# Patient Record
Sex: Male | Born: 1965 | Race: White | Hispanic: No | Marital: Married | Smoking: Never smoker
Health system: Southern US, Community
[De-identification: ages and names within clinical notes are randomized; demographics above are authoritative.]

## PROBLEM LIST (undated history)

## (undated) DIAGNOSIS — IMO0002 Reserved for concepts with insufficient information to code with codable children: Secondary | ICD-10-CM

## (undated) DIAGNOSIS — R9430 Abnormal result of cardiovascular function study, unspecified: Secondary | ICD-10-CM

## (undated) DIAGNOSIS — I4892 Unspecified atrial flutter: Secondary | ICD-10-CM

## (undated) DIAGNOSIS — I4819 Other persistent atrial fibrillation: Secondary | ICD-10-CM

## (undated) DIAGNOSIS — T7840XA Allergy, unspecified, initial encounter: Secondary | ICD-10-CM

## (undated) DIAGNOSIS — Z79899 Other long term (current) drug therapy: Secondary | ICD-10-CM

## (undated) HISTORY — DX: Allergy, unspecified, initial encounter: T78.40XA

## (undated) HISTORY — DX: Other long term (current) drug therapy: Z79.899

## (undated) HISTORY — PX: TONSILLECTOMY AND ADENOIDECTOMY: SUR1326

## (undated) HISTORY — DX: Unspecified atrial flutter: I48.92

## (undated) HISTORY — DX: Other persistent atrial fibrillation: I48.19

## (undated) HISTORY — DX: Reserved for concepts with insufficient information to code with codable children: IMO0002

## (undated) HISTORY — DX: Abnormal result of cardiovascular function study, unspecified: R94.30

---
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
## 1998-03-24 HISTORY — PX: HERNIA REPAIR: SHX51

## 2011-07-22 ENCOUNTER — Encounter: Payer: Self-pay | Admitting: Family Medicine

## 2011-07-22 ENCOUNTER — Ambulatory Visit (INDEPENDENT_AMBULATORY_CARE_PROVIDER_SITE_OTHER): Payer: BC Managed Care – PPO | Admitting: Family Medicine

## 2011-07-22 VITALS — BP 118/72 | HR 84 | Temp 97.8°F | Ht 72.5 in | Wt 226.0 lb

## 2011-07-22 DIAGNOSIS — J309 Allergic rhinitis, unspecified: Secondary | ICD-10-CM

## 2011-07-22 DIAGNOSIS — I499 Cardiac arrhythmia, unspecified: Secondary | ICD-10-CM

## 2011-07-22 DIAGNOSIS — J329 Chronic sinusitis, unspecified: Secondary | ICD-10-CM

## 2011-07-22 DIAGNOSIS — I4892 Unspecified atrial flutter: Secondary | ICD-10-CM

## 2011-07-22 DIAGNOSIS — Z9109 Other allergy status, other than to drugs and biological substances: Secondary | ICD-10-CM

## 2011-07-22 MED ORDER — AZITHROMYCIN 250 MG PO TABS: ORAL_TABLET | ORAL | Status: AC

## 2011-07-22 MED ORDER — METHYLPREDNISOLONE ACETATE 80 MG/ML IJ SUSP
80.0000 mg | Freq: Once | INTRAMUSCULAR | Status: AC
  Administered 2011-07-22: 80 mg via INTRAMUSCULAR

## 2011-07-22 NOTE — Progress Notes (Signed)
Addended by: Gershon Crane A on: 07/22/2011 01:03 PM   Modules accepted: Orders

## 2011-07-22 NOTE — Progress Notes (Signed)
  Subjective:    Patient ID: Shawn Hurley, male    DOB: 04-25-1965, 46 y.o.   MRN: [id number]  HPI 46 yr old male to establish and to discuss allergy issues. He has trouble every spring when the pollen shows up, and he has been struggling the past 3 weeks. He has had a stuffy head, sinus pressure, HA, PND, ST, and a dry cough. No fever. He has been taking Zyrtec every day, which helps some of the symptoms. He has tried Flonase sprays, but these do not help at all.    Review of Systems  Constitutional: Negative.   HENT: Positive for congestion, postnasal drip and sinus pressure.   Eyes: Negative.   Respiratory: Positive for cough and chest tightness. Negative for choking, shortness of breath and wheezing.   Cardiovascular: Negative.        Objective:   Physical Exam  Constitutional: He appears well-developed and well-nourished.  HENT:  Right Ear: External ear normal.  Left Ear: External ear normal.  Nose: Nose normal.  Mouth/Throat: Oropharynx is clear and moist. No oropharyngeal exudate.  Eyes: Conjunctivae are normal. Pupils are equal, round, and reactive to light.  Neck: Neck supple. No thyromegaly present.  Cardiovascular: Normal rate, normal heart sounds and intact distal pulses.  Exam reveals no gallop.   No murmur heard.      Rhythm is quite irregular.  EKG shows atrial flutter/fibrillation  Pulmonary/Chest: Effort normal and breath sounds normal. No respiratory distress. He has no wheezes. He has no rales.  Lymphadenopathy:    He has no cervical adenopathy.          Assessment & Plan:  We will treat the sinusitis with a Zpack. Add Mucinex for congestion. I talked to him about the atrial flutter/fib, and he admits to having some mild fatigue and SOB for several months. No palpitations or chest pains. He says he had a cpx in [place]. [place] about 8 months ago before he moved here, and the doctor told him he had an "irregular rhythm". However he did not discuss this with him, and  did not seem to be concerned about this. We will refer him to Cardiology to evaluate further.

## 2011-07-25 ENCOUNTER — Encounter: Payer: Self-pay | Admitting: Cardiology

## 2011-07-25 ENCOUNTER — Ambulatory Visit (INDEPENDENT_AMBULATORY_CARE_PROVIDER_SITE_OTHER): Payer: BC Managed Care – PPO | Admitting: Cardiology

## 2011-07-25 VITALS — BP 110/80 | HR 83 | Ht 72.05 in | Wt 226.4 lb

## 2011-07-25 DIAGNOSIS — I4891 Unspecified atrial fibrillation: Secondary | ICD-10-CM

## 2011-07-25 LAB — TSH: TSH: 0.987 u[IU]/mL (ref 0.350–4.500)

## 2011-07-25 NOTE — Patient Instructions (Addendum)
Your physician recommends that you schedule a follow-up appointment in: 3-4 weeks.  Your physician has requested that you have an echocardiogram. Echocardiography is a painless test that uses sound waves to create images of your heart. It provides your doctor with information about the size and shape of your heart and how well your heart's chambers and valves are working. This procedure takes approximately one hour. There are no restrictions for this procedure.  Your physician has requested that you have a stress echocardiogram. For further information please visit [email or web address] Please follow instruction sheet as given.  Your physician recommends that you return for lab work in: today (tsh)

## 2011-07-25 NOTE — Progress Notes (Signed)
HPI Patient is seen today as a new patient evaluation for atrial fibrillation. He's healthy gentleman who has no significant complaints. He was being seen by Dr. Clent Ridges. His rhythm was noted to be irregular. EKG revealed the rhythm that is most probably atrial fibrillation. At some point it looks like atrial flutter. He was asymptomatic. He does not have chest pain. He has had some shortness of breath when climbing stairs historically. This has not changed. He's not had syncope or presyncope. There is no history of CHF, hypertension, diabetes, or prior stroke. He is 46 years of age. He is the new chef at the [employer]  No Known Allergies  Current Outpatient Prescriptions  Medication Sig Dispense Refill  . Ascorbic Acid (VITAMIN C) 1000 MG tablet Take 1,000 mg by mouth daily.      Marland Kitchen azithromycin (ZITHROMAX) 250 MG tablet As directed  6 tablet  0  . cetirizine (ZYRTEC) 10 MG tablet Take 10 mg by mouth daily.      . fish oil-omega-3 fatty acids 1000 MG capsule Take 1 g by mouth daily.      Marland Kitchen glucosamine-chondroitin 500-400 MG tablet Take 2 tablets by mouth daily.      Marland Kitchen guaiFENesin (MUCINEX) 600 MG 12 hr tablet Take 600 mg by mouth 2 (two) times daily.      Marland Kitchen ibuprofen (ADVIL,MOTRIN) 200 MG tablet Take 200 mg by mouth as needed.      . Multiple Vitamin (MULTIVITAMIN) capsule Take 1 capsule by mouth daily.      . vitamin E 400 UNIT capsule Take 400 Units by mouth daily.        History   Social History  . Marital Status: Married    Spouse Name: N/A    Number of Children: N/A  . Years of Education: N/A   Occupational History  . Not on file.   Social History Main Topics  . Smoking status: Never Smoker   . Smokeless tobacco: Never Used  . Alcohol Use: 3.5 oz/week    7 drink(s) per week  . Drug Use: No  . Sexually Active: Not on file   Other Topics Concern  . Not on file   Social History Narrative  . No narrative on file    Family History  Problem Relation Age of  Onset  . Stroke Mother     Past Medical History  Diagnosis Date  . Allergy   . Atrial fibrillation     Past Surgical History  Procedure Date  . Hernia repair 2000    umbilical hernia  . Wrist surgery 1979    left wrist, to drain an infection   . Tonsillectomy and adenoidectomy     ROS  Patient denies fever, chills, headache, sweats, rash, change in vision, change in hearing, chest pain, cough, nausea vomiting, urinary symptoms. All other systems are reviewed and are negative.  PHYSICAL EXAM   Patient is oriented to person time and place. Affect is normal. Head is atraumatic. There is no jugular venous distention. No carotid bruits. Lungs are clear. Respiratory effort is nonlabored. Cardiac exam reveals S1 and S2. The rhythm is regular today. There no clicks or significant murmurs. The abdomen is soft. Is no peripheral edema. There no musculoskeletal deformities. There are no skin rashes.  Filed Vitals:   07/25/11 1541  BP: 110/80  Pulse: 83  Height: 6' 0.05" (1.83 m)  Weight: 226 lb 6.4 oz (102.694 kg)   Her EKG is done today and  reviewed by me. QRS is normal. The rhythm is normal sinus with PACs. I have reviewed a prior tracing of July 22, 2011. This rhythm was probably atrial fib. I cannot rule out atrial flutter.  ASSESSMENT & PLAN

## 2011-07-25 NOTE — Assessment & Plan Note (Signed)
The patient has paroxysmal atrial fibrillation. He is asymptomatic. He does have some exertional shortness of breath. His CHADS score is 0. It appears that his rate is controlled when he has the rhythm. He does not need an anticoagulant at this time. The plan will be to obtain a TSH. We will also do a standard echo. He will also have a stress echo. In the meantime he'll go by full activities. I will then see him back for followup. I will consider a monitor at some point to see if we can obtain his atrial fib burden. I've chosen not to do a standard holder as we may just get 2448 hrs. Of sinus rhythm.

## 2011-08-07 ENCOUNTER — Telehealth: Payer: Self-pay

## 2011-08-07 ENCOUNTER — Other Ambulatory Visit: Payer: Self-pay

## 2011-08-07 DIAGNOSIS — I4891 Unspecified atrial fibrillation: Secondary | ICD-10-CM

## 2011-08-07 NOTE — Telephone Encounter (Signed)
Received call from [facility] in scheduling, patient's insurance denied stress echo.She stated insurance recommends treadmill first.Order entered for patient to have treadmill.

## 2011-08-11 ENCOUNTER — Other Ambulatory Visit: Payer: Self-pay

## 2011-08-11 ENCOUNTER — Ambulatory Visit (HOSPITAL_COMMUNITY): Payer: BC Managed Care – PPO | Attending: Internal Medicine

## 2011-08-11 ENCOUNTER — Other Ambulatory Visit (HOSPITAL_COMMUNITY): Payer: BC Managed Care – PPO

## 2011-08-11 DIAGNOSIS — I4891 Unspecified atrial fibrillation: Secondary | ICD-10-CM | POA: Insufficient documentation

## 2011-08-12 NOTE — Telephone Encounter (Signed)
okay

## 2011-08-13 ENCOUNTER — Other Ambulatory Visit (HOSPITAL_COMMUNITY): Payer: BC Managed Care – PPO

## 2011-08-13 NOTE — Telephone Encounter (Signed)
GXT scheduled on 08/22/11 with Ward Givens NP.

## 2011-08-20 NOTE — Progress Notes (Signed)
Pt was notified.  

## 2011-08-21 ENCOUNTER — Ambulatory Visit: Payer: BC Managed Care – PPO | Admitting: Cardiology

## 2011-08-22 ENCOUNTER — Encounter (INDEPENDENT_AMBULATORY_CARE_PROVIDER_SITE_OTHER): Payer: BC Managed Care – PPO

## 2011-08-22 ENCOUNTER — Ambulatory Visit (INDEPENDENT_AMBULATORY_CARE_PROVIDER_SITE_OTHER): Payer: BC Managed Care – PPO | Admitting: Nurse Practitioner

## 2011-08-22 ENCOUNTER — Encounter: Payer: Self-pay | Admitting: Nurse Practitioner

## 2011-08-22 ENCOUNTER — Encounter: Payer: Self-pay | Admitting: Cardiology

## 2011-08-22 VITALS — BP 135/88 | HR 76 | Ht 72.5 in | Wt 226.0 lb

## 2011-08-22 DIAGNOSIS — R0989 Other specified symptoms and signs involving the circulatory and respiratory systems: Secondary | ICD-10-CM

## 2011-08-22 DIAGNOSIS — I4891 Unspecified atrial fibrillation: Secondary | ICD-10-CM

## 2011-08-22 DIAGNOSIS — I480 Paroxysmal atrial fibrillation: Secondary | ICD-10-CM

## 2011-08-22 MED ORDER — ASPIRIN EC 81 MG PO TBEC: 81.0000 mg | DELAYED_RELEASE_TABLET | Freq: Every day | ORAL | Status: DC

## 2011-08-22 MED ORDER — METOPROLOL SUCCINATE ER 25 MG PO TB24: 25.0000 mg | ORAL_TABLET | Freq: Every day | ORAL | Status: DC

## 2011-08-22 NOTE — Patient Instructions (Signed)
Your physician recommends that you schedule a follow-up appointment as scheduled Your physician has recommended that you wear an event monitor. Event monitors are medical devices that record the heart's electrical activity. Doctors most often [place] these monitors to diagnose arrhythmias. Arrhythmias are problems with the speed or rhythm of the heartbeat. The monitor is a small, portable device. You can wear one while you do your normal daily activities. This is usually used to diagnose what is causing palpitations/syncope (passing out).  Your physician has recommended you make the following change in your medication: START Metoprolol Succ 25 mg daily and Aspirin  81 mg daily

## 2011-08-22 NOTE — Progress Notes (Signed)
Patient Name: Shawn Hurley Date of Encounter: 08/22/2011  Primary Care Provider:  Nelwyn Salisbury, MD, MD Primary Cardiologist:  Lovena Neighbours, MD  Patient Profile  47 year old male with recent diagnosis of paroxysmal atrial fibrillation who presents for followup.  Problem List   Past Medical History  Diagnosis Date  . Allergy   . Atrial fibrillation     a. Paroxysmal atrial fibrillation, April, 2013, asymptomatic;  b. 07/2011 Echo: EF 55-60%   Past Surgical History  Procedure Date  . Hernia repair 2000    umbilical hernia  . Wrist surgery 1979    left wrist, to drain an infection   . Tonsillectomy and adenoidectomy     Allergies  No Known Allergies  HPI  46 year old male with the above problems.  He was recently seen by Dr. Myrtis Ser secondary to an incidental finding of irregular heart rhythm and diagnosis of atrial fibrillation.  When he was seen by Dr. Myrtis Ser, he was in sinus rhythm with good rate.  His CHADS2 score is zero.  He was arranged for an exercise treadmill test to take place today however upon arrival, he was noted to be in atrial fibrillation at a rate of 126.  He is completely asymptomatic.  He has no idea how long he may have been in this rhythm or whether or not he goes in and out of it.  Because of the rapid A. Fib, we canceled his treadmill and I have seen him in clinic today.  He denies any chest pain or dyspnea, palpitations, dizziness, or syncope.  Home Medications  Prior to Admission medications   Medication Sig Start Date End Date Taking? Authorizing Provider  Ascorbic Acid (VITAMIN C) 1000 MG tablet Take 1,000 mg by mouth daily.    Historical Provider, MD  aspirin EC 81 MG tablet Take 1 tablet (81 mg total) by mouth daily. 08/22/11 08/21/12  Ok Anis, NP  cetirizine (ZYRTEC) 10 MG tablet Take 10 mg by mouth daily.    Historical Provider, MD  fish oil-omega-3 fatty acids 1000 MG capsule Take 1 g by mouth daily.    Historical Provider, MD    glucosamine-chondroitin 500-400 MG tablet Take 2 tablets by mouth daily.    Historical Provider, MD  guaiFENesin (MUCINEX) 600 MG 12 hr tablet Take 600 mg by mouth 2 (two) times daily.    Historical Provider, MD  ibuprofen (ADVIL,MOTRIN) 200 MG tablet Take 200 mg by mouth as needed.    Historical Provider, MD  metoprolol succinate (TOPROL XL) 25 MG 24 hr tablet Take 1 tablet (25 mg total) by mouth daily. 08/22/11 08/21/12  Ok Anis, NP  Multiple Vitamin (MULTIVITAMIN) capsule Take 1 capsule by mouth daily.    Historical Provider, MD  vitamin E 400 UNIT capsule Take 400 Units by mouth daily.    Historical Provider, MD    Family History  Family History  Problem Relation Age of Onset  . Stroke Mother     Social History  History   Social History  . Marital Status: Married    Spouse Name: N/A    Number of Children: N/A  . Years of Education: N/A   Occupational History  . Not on file.   Social History Main Topics  . Smoking status: Never Smoker   . Smokeless tobacco: Never Used  . Alcohol Use: 3.5 oz/week    7 drink(s) per week  . Drug Use: No  . Sexually Active: Not on file   Other  Topics Concern  . Not on file   Social History Narrative  . No narrative on file     Review of Systems He has been doing well without complaints. He denies pnd, orthopnea, n, v, dizziness, syncope, edema, weight gain, or early satiety. All other systems reviewed and are otherwise negative except as noted above.  Physical Exam  There were no vitals taken for this visit.  General: Pleasant, NAD Psych: Normal affect. Neuro: Alert and oriented X 3. Moves all extremities spontaneously. HEENT: Normal  Neck: Supple without bruits or JVD. Lungs:  Resp regular and unlabored, CTA. Heart: irregularly irregular no s3, s4, or murmurs. Abdomen: Soft, non-tender, non-distended, BS + x 4.  Extremities: No clubbing, cyanosis or edema. DP/PT/Radials 2+ and equal bilaterally.  Accessory  Clinical Findings  ECG - atrial fibrillation, 126, no acute ST or T changes.  Assessment & Plan  1.  Paroxysmal atrial fibrillation: The patient is back in atrial fibrillation but completely asymptomatic.  We canceled his treadmill test and I have provided him a prescription for Toprol-XL 25 mg daily.  I've also recommended low-dose aspirin.  He has already had a normal echocardiogram and normal TSH.  I will arrange for a 21 day event monitor to determine his burden of atrial fibrillation as well as success with rate control on beta blocker therapy.  We will arrange followup with Dr. Myrtis Ser in a few weeks.  Nicolasa Ducking, NP 08/22/2011, 5:39 PM

## 2011-08-22 NOTE — Procedures (Deleted)
Exercise Treadmill Test  Pre-Exercise Testing Evaluation Rhythm: {CHL RHYTHM BASELINE EKG FOR ZOX:09604540}  Rate: {CHL RATE BASELINE EKG FOR ETT:21021048}   PR:  {CHL PR BASELINE EKG FOR ETT:21021049} QRS:  {CHL QRS BASELINE EKG FOR JWJ:19147829}  QT:  {CHL QT BASELINE EKG FOR ETT:21021051} QTc: {CHL QTC BASELINE EKG FOR FAO:13086578}     Test  Exercise Tolerance Test Ordering MD: {CHL LB CARDIOLOGY MD FOR ION:62952841}  Interpreting MD: {CHL LB CARDIOLOGY INTERPRET MD FOR LKG:40102725}  Unique Test No: ***  Treadmill:  {CHL TREADMILL # FOR DGU:44034742}  Indication for ETT: {CHL INDICATION FOR VZD:63875643}  Contraindication to ETT: {CHL CONTRAINDICATION TO PIR:51884166}   Stress Modality: {CHL STRESS MODALITY FOR AYT:01601093}  Cardiac Imaging Performed: {CHL CARDIAC IMAGING PERFORMED FOR ATF:57322025}   Protocol: {CHL PROTOCOL FOR ETT:21021061}  Max BP:  ***/***  Max MPHR (bpm):  *** 85% MPR (bpm):  ***  MPHR obtained (bpm):  *** % MPHR obtained:  ***  Reached 85% MPHR (min:sec):  *** Total Exercise Time (min-sec):  ***  Workload in METS:  *** Borg Scale: ***  Reason ETT Terminated:  {CHL REASON TERMINATED FOR ETT:21021064}    ST Segment Analysis At Rest: {CHL ST SEGMENT AT REST FOR KYH:06237628} With Exercise: {CHL ST SEGMENT WITH EXERCISE FOR BTD:17616073}  Other Information Arrhythmia:  {CHL ARRHYTHMIA FOR XTG:62694854} Angina during ETT:  {CHL ANGINA DURING OEV:03500938} Quality of ETT:  {CHL QUALITY OF HWE:99371696}  ETT Interpretation:  {CHL INTERPRETATION FOR VEL:38101751}  Comments: ***  Recommendations: ***

## 2011-08-31 ENCOUNTER — Encounter: Payer: Self-pay | Admitting: Cardiology

## 2011-08-31 DIAGNOSIS — R9430 Abnormal result of cardiovascular function study, unspecified: Secondary | ICD-10-CM | POA: Insufficient documentation

## 2011-09-02 ENCOUNTER — Ambulatory Visit (INDEPENDENT_AMBULATORY_CARE_PROVIDER_SITE_OTHER): Payer: BC Managed Care – PPO | Admitting: Cardiology

## 2011-09-02 ENCOUNTER — Encounter: Payer: Self-pay | Admitting: Cardiology

## 2011-09-02 VITALS — BP 118/68 | HR 75 | Ht 73.0 in | Wt 231.0 lb

## 2011-09-02 DIAGNOSIS — I480 Paroxysmal atrial fibrillation: Secondary | ICD-10-CM

## 2011-09-02 DIAGNOSIS — I4891 Unspecified atrial fibrillation: Secondary | ICD-10-CM

## 2011-09-02 NOTE — Patient Instructions (Signed)
Your physician recommends that you schedule a follow-up appointment in: 3 months with Dr Chancy Hurter have been referred to Dr Johney Frame for evaluation of your atrial fibrillation  Please continue with the event recorder

## 2011-09-02 NOTE — Assessment & Plan Note (Signed)
I have reviewed the data available an event recorder. I reviewed the echo report showing normal LV function. TSH is normal. The decision at this point he is whether he should receive antiarrhythmic therapy or be seen by electrophysiology for further input. He does not feel his atrial fibrillation. He is young. In his case I wonder if possible ablation will be considered sooner rather than later. I am aware that frequently ablation criteria required the patient has failed 1 drug. In his case and will be difficult to know if he is feeling a drug or not because he does not since his atrial fibrillation. One approach would be to give him a drug and monitor him to see what his atrial fib or numbness. My preference is to get expert opinion at this time from electrophysiology. I explained this to him. We will arrange for consultation. His embolic risk score is very low. He does not need to be anticoagulated.

## 2011-09-02 NOTE — Progress Notes (Signed)
HPI  Patient is seen for followup of atrial fibrillation. I saw him as a new patient on Jul 25, 2011. He had atrial fib. He has no significant embolic risk factors.His EKG when I saw him in the office was sinus rhythm. Decision was made to obtain a TSH that was normal. Decision was also made to do a stress echo. When the patient arrived for his stress echo he was in atrial fibrillation.Therefore the stress was not done but the echo was done. He has normal left ventricular function.Plans there were made for him to wear an event recorder. He has worn it for approximately 2 weeks. He has a significant atrial fibrillation burden. He never feels his atrial fibrillation. He is now here for followup.   No Known Allergies  Current Outpatient Prescriptions  Medication Sig Dispense Refill  . Ascorbic Acid (VITAMIN C) 1000 MG tablet Take 1,000 mg by mouth daily.      Marland Kitchen aspirin EC 81 MG tablet Take 1 tablet (81 mg total) by mouth daily.  90 tablet  3  . cetirizine (ZYRTEC) 10 MG tablet Take 10 mg by mouth daily.      . fish oil-omega-3 fatty acids 1000 MG capsule Take 1 g by mouth daily.      Marland Kitchen glucosamine-chondroitin 500-400 MG tablet Take 2 tablets by mouth daily.      Marland Kitchen guaiFENesin (MUCINEX) 600 MG 12 hr tablet Take 600 mg by mouth as needed.       Marland Kitchen ibuprofen (ADVIL,MOTRIN) 200 MG tablet Take 200 mg by mouth as needed.      . metoprolol succinate (TOPROL XL) 25 MG 24 hr tablet Take 1 tablet (25 mg total) by mouth daily.  30 tablet  11  . Multiple Vitamin (MULTIVITAMIN) capsule Take 1 capsule by mouth daily.      . vitamin E 400 UNIT capsule Take 400 Units by mouth daily.        History   Social History  . Marital Status: Married    Spouse Name: N/A    Number of Children: N/A  . Years of Education: N/A   Occupational History  . Not on file.   Social History Main Topics  . Smoking status: Never Smoker   . Smokeless tobacco: Never Used  . Alcohol Use: 3.5 oz/week    7 drink(s) per week  .  Drug Use: No  . Sexually Active: Not on file   Other Topics Concern  . Not on file   Social History Narrative  . No narrative on file    Family History  Problem Relation Age of Onset  . Stroke Mother     Past Medical History  Diagnosis Date  . Allergy   . Atrial fibrillation     a. Paroxysmal atrial fibrillation, April, 2013, asymptomatic;  b. 07/2011 Echo: EF 55-60%  . Ejection fraction     EF 55-60%, echo, May, 2013    Past Surgical History  Procedure Date  . Hernia repair 2000    umbilical hernia  . Wrist surgery 1979    left wrist, to drain an infection   . Tonsillectomy and adenoidectomy     ROS  Patient denies fever, chills, headache, sweats, rash, change in vision, change in hearing, chest pain, cough, nausea vomiting, urinary symptoms. All other systems are reviewed and are negative.  PHYSICAL EXAM  Patient is oriented to person time and place. Affect is normal. Head is atraumatic. There is no jugulovenous distention. Lungs are  clear. Respiratory effort is nonlabored. Cardiac exam reveals S1 and S2. There no clicks or significant murmurs. The abdomen is soft. There is no peripheral edema. There no musculoskeletal deformities. There are no skin rashes.  Filed Vitals:   09/02/11 0926  BP: 118/68  Pulse: 75  Height: 6\' 1"  (1.854 m)  Weight: 231 lb (104.781 kg)   I have reviewed the strips are available so far on the event recorder. He has a significant amount of atrial fibrillation. He has reasonable rate control on the current beta blocker.  ASSESSMENT & PLAN

## 2011-10-27 ENCOUNTER — Encounter: Payer: Self-pay | Admitting: Internal Medicine

## 2011-10-27 ENCOUNTER — Ambulatory Visit (INDEPENDENT_AMBULATORY_CARE_PROVIDER_SITE_OTHER): Payer: BC Managed Care – PPO | Admitting: Internal Medicine

## 2011-10-27 VITALS — BP 128/76 | HR 134 | Resp 19 | Ht 73.0 in | Wt 233.0 lb

## 2011-10-27 DIAGNOSIS — I4891 Unspecified atrial fibrillation: Secondary | ICD-10-CM

## 2011-10-27 DIAGNOSIS — R0683 Snoring: Secondary | ICD-10-CM

## 2011-10-27 DIAGNOSIS — R0609 Other forms of dyspnea: Secondary | ICD-10-CM

## 2011-10-27 MED ORDER — DILTIAZEM HCL ER COATED BEADS 240 MG PO CP24: 240.0000 mg | ORAL_CAPSULE | Freq: Every day | ORAL | Status: DC

## 2011-10-27 NOTE — Patient Instructions (Addendum)
Your physician recommends that you schedule a follow-up appointment in: 6 weeks Dr Johney Frame  Your physician has recommended you make the following change in your medication:   1) Stop Toprol 2) Start Diltiazem 240mg  daily    Your physician has recommended that you have a sleep study. This test records several body functions during sleep, including: brain activity, eye movement, oxygen and carbon dioxide blood levels, heart rate and rhythm, breathing rate and rhythm, the flow of air through your mouth and nose, snoring, body muscle movements, and chest and belly movement.

## 2011-10-27 NOTE — Progress Notes (Signed)
Primary Care Physician: Nelwyn Salisbury, MD Referring Physician:  Dr Dorian Pod is a 46 y.o. male with a h/o recently diagnosed atrial fibrillation who presents today for EP consultation.  He reports initially being diagnosed with atrial fibrillation several months ago after presenting to Dr Claris Che office 07/22/11 for evaluation of URI symptoms.  He was noted to have an irregular heart beat and was found to have afib by ekg.  He was asymptomatic with his afib.  The duration of his afib is therefore unknown.  He was referred to Dr Myrtis Ser for further management.  He was placed on toprol for rate control and had an event monitor placed.  His monitor revealed significant afib with occasional elevation in V rates.  He did have sinus rhythm also documented.  He is unable to tell when he is in afib.  He reports that his exercise tolerance is reasonably preserved though he is occasionally SOB with heavy activity. Today, he denies symptoms of palpitations, chest pain, orthopnea, PND, lower extremity edema, fatigue, dizziness, presyncope, syncope, or neurologic sequela. The patient is tolerating medications without difficulties and is otherwise without complaint today.   Past Medical History  Diagnosis Date  . Allergy   . Persistent atrial fibrillation     a. Paroxysmal atrial fibrillation, April, 2013, asymptomatic;  b. 07/2011 Echo: EF 55-60%  . Ejection fraction     EF 55-60%, echo, May, 2013   Past Surgical History  Procedure Date  . Hernia repair 2000    umbilical hernia  . Wrist surgery 1979    left wrist, to drain an infection   . Tonsillectomy and adenoidectomy     Current Outpatient Prescriptions  Medication Sig Dispense Refill  . Ascorbic Acid (VITAMIN C) 1000 MG tablet Take 1,000 mg by mouth daily.      Marland Kitchen aspirin EC 81 MG tablet Take 1 tablet (81 mg total) by mouth daily.  90 tablet  3  . fish oil-omega-3 fatty acids 1000 MG capsule Take 1 g by mouth daily.      Marland Kitchen  glucosamine-chondroitin 500-400 MG tablet Take 2 tablets by mouth daily.      Marland Kitchen ibuprofen (ADVIL,MOTRIN) 200 MG tablet Take 200 mg by mouth as needed.      . Multiple Vitamin (MULTIVITAMIN) capsule Take 1 capsule by mouth daily.      . vitamin E 400 UNIT capsule Take 400 Units by mouth daily.      . cetirizine (ZYRTEC) 10 MG tablet Take 10 mg by mouth daily.      Marland Kitchen diltiazem (CARDIZEM CD) 240 MG 24 hr capsule Take 1 capsule (240 mg total) by mouth daily.  90 capsule  3  . guaiFENesin (MUCINEX) 600 MG 12 hr tablet Take 600 mg by mouth as needed.         No Known Allergies  History   Social History  . Marital Status: Married    Spouse Name: N/A    Number of Children: N/A  . Years of Education: N/A   Occupational History  . Not on file.   Social History Main Topics  . Smoking status: Never Smoker   . Smokeless tobacco: Never Used  . Alcohol Use: 3.5 oz/week    7 drink(s) per week     1-2 drinks per day  . Drug Use: No  . Sexually Active: Not on file   Other Topics Concern  . Not on file   Social History Narrative   Lives  in [place]. Works as Water engineer at the [employer]    Family History  Problem Relation Age of Onset  . Stroke Mother     ROS- All systems are reviewed and negative except as per the HPI above  Physical Exam: Filed Vitals:   10/27/11 1146  BP: 128/76  Pulse: 134  Resp: 19  Height: 6\' 1"  (1.854 m)  Weight: 233 lb (105.688 kg)  SpO2: 96%    GEN- The patient is well appearing, alert and oriented x 3 today.   Head- normocephalic, atraumatic Eyes-  Sclera clear, conjunctiva pink Ears- hearing intact Oropharynx- clear Neck- supple, no JVP Lymph- no cervical lymphadenopathy Lungs- Clear to ausculation bilaterally, normal work of breathing Heart- irregular rate and rhythm, no murmurs, rubs or gallops, PMI not laterally displaced GI- soft, NT, ND, + BS Extremities- no clubbing, cyanosis, or edema MS- no significant deformity or  atrophy Skin- no rash or lesion Psych- euthymic mood, full affect Neuro- strength and sensation are intact  EKG today reveals afib V rate 90-110 bpm  Assessment and Plan:

## 2011-10-27 NOTE — Assessment & Plan Note (Signed)
The patient has asymptomatic atrial fibrillation.  Though he had some sinus rhythm on his event monitor, I suspect that he is approaching persistent afib.  Ventricular rates are elevated today. Therapeutic strategies for afib including rate control and rhythm control were discussed in detail with the patient today.  We discussed antiarrhythmic medicine options.  We discussed findings of the AFFIRM trial.   We also discussion ablation, though presently the only indication for catheter ablation is for symptomatic treatment of afib.  As he is presently asymptomatic, I am not certain that he would receive benefit from ablation. At this point, I will stop toprol (which may causes fatigue or ED at higher doses) and start cardizem CD 240mg  daily.  This can be up titrated as needed for rate control.  He will contemplate rhythm control as an option and I will see him again in 6 weeks.  If he decides to proceed with rhythm control, then I would favor flecainide 100mg  BID.  He may require a short treatment with a novel anticoagulant unless he is in sinus rhythm upon return prior to initiation of an AAD.  If he finds that he actually does feel better in sinus rhythm, then ablation may at that time be a reasonable option. Long term, however, he should not require anticoagulation as his CHADSVASC score is 0.

## 2011-11-13 ENCOUNTER — Ambulatory Visit (HOSPITAL_BASED_OUTPATIENT_CLINIC_OR_DEPARTMENT_OTHER): Payer: BC Managed Care – PPO | Attending: Internal Medicine | Admitting: Radiology

## 2011-11-13 VITALS — Ht 73.0 in | Wt 230.0 lb

## 2011-11-13 DIAGNOSIS — R0683 Snoring: Secondary | ICD-10-CM

## 2011-11-13 DIAGNOSIS — I4891 Unspecified atrial fibrillation: Secondary | ICD-10-CM

## 2011-11-13 DIAGNOSIS — G4710 Hypersomnia, unspecified: Secondary | ICD-10-CM | POA: Insufficient documentation

## 2011-11-20 DIAGNOSIS — G4730 Sleep apnea, unspecified: Secondary | ICD-10-CM

## 2011-11-20 DIAGNOSIS — G4710 Hypersomnia, unspecified: Secondary | ICD-10-CM

## 2011-11-21 NOTE — Procedures (Signed)
NAME:  Shawn Hurley, Shawn Hurley                ACCOUNT NO.:  [id number]  MEDICAL RECORD NO.:  [id number]          PATIENT TYPE:  OUT  LOCATION:  SLEEP CENTER                 FACILITY:  [facility]  PHYSICIAN:  Barbaraann Share, MD,FCCPDATE OF BIRTH:  10/04/64  DATE OF STUDY:  11/13/2011                           NOCTURNAL POLYSOMNOGRAM  REFERRING PHYSICIAN:  Hillis Range, MD  INDICATION FOR STUDY:  Hypersomnia with sleep apnea.  EPWORTH SLEEPINESS SCORE:  4.  SLEEP ARCHITECTURE:  The patient had a total sleep time of 357 minutes with adequate slow-wave sleep for age but decreased quantity of REM. Sleep onset latency was normal at 2.5 minutes and REM onset was normal at 101 minutes.  Sleep efficiency was normal at 92%.  RESPIRATORY DATA:  The patient was found to have 2 apneas and 3 obstructive hypopneas, giving him an apnea-hypopnea index of only 0.8 events per hour.  He was noted to have mild snoring, and the events listed above occurred in all body positions.  OXYGEN DATA:  There was O2 desaturation as low as 89% with the patient's obstructive events.  CARDIAC DATA:  Occasional PAC noted, but no clinically significant arrhythmias were seen.  MOVEMENT-PARASOMNIA:  The patient had moderate numbers of limb movements with no significant sleep disruption noted.  IMPRESSIONS-RECOMMENDATIONS: 1. Small numbers of obstructive events which do not meet the AHI     criteria for the obstructive sleep apnea syndrome.  The patient did     not meet split night criteria secondary to his small numbers of     events. 2. Occasional PAC noted, but no clinically significant arrhythmias     were seen.     Barbaraann Share, MD,FCCP Diplomate, American Board of Sleep Medicine    [facility]/MED[facility]  D:  11/20/2011 14:16:21  T:  11/21/2011 04:32:16  Job:  [id number]

## 2011-12-08 ENCOUNTER — Encounter: Payer: Self-pay | Admitting: Internal Medicine

## 2011-12-08 ENCOUNTER — Ambulatory Visit (INDEPENDENT_AMBULATORY_CARE_PROVIDER_SITE_OTHER): Payer: BC Managed Care – PPO | Admitting: Internal Medicine

## 2011-12-08 VITALS — BP 108/64 | HR 76 | Ht 73.0 in | Wt 237.0 lb

## 2011-12-08 DIAGNOSIS — I4891 Unspecified atrial fibrillation: Secondary | ICD-10-CM

## 2011-12-08 MED ORDER — DABIGATRAN ETEXILATE MESYLATE 150 MG PO CAPS: 150.0000 mg | ORAL_CAPSULE | Freq: Two times a day (BID) | ORAL | Status: DC

## 2011-12-08 MED ORDER — FLECAINIDE ACETATE 100 MG PO TABS: 100.0000 mg | ORAL_TABLET | Freq: Two times a day (BID) | ORAL | Status: DC

## 2011-12-08 NOTE — Progress Notes (Signed)
PCP:  Nelwyn Salisbury, MD Primary Cardiologist:  Dr Myrtis Ser  The patient presents today for routine electrophysiology followup.  Since last being seen in our clinic, the patient reports doing very well.  Today, he denies symptoms of palpitations, chest pain, shortness of breath, orthopnea, PND, lower extremity edema, dizziness, presyncope, syncope, or neurologic sequela.  The patient feels that he is tolerating medications without difficulties and is otherwise without complaint today.   Past Medical History  Diagnosis Date  . Allergy   . Persistent atrial fibrillation     a. Paroxysmal atrial fibrillation, April, 2013, asymptomatic;  b. 07/2011 Echo: EF 55-60%  . Ejection fraction     EF 55-60%, echo, May, 2013   Past Surgical History  Procedure Date  . Hernia repair 2000    umbilical hernia  . Wrist surgery 1979    left wrist, to drain an infection   . Tonsillectomy and adenoidectomy     Current Outpatient Prescriptions  Medication Sig Dispense Refill  . Ascorbic Acid (VITAMIN C) 1000 MG tablet Take 1,000 mg by mouth daily.      Marland Kitchen aspirin EC 81 MG tablet Take 1 tablet (81 mg total) by mouth daily.  90 tablet  3  . diltiazem (CARDIZEM CD) 240 MG 24 hr capsule Take 1 capsule (240 mg total) by mouth daily.  90 capsule  3  . fish oil-omega-3 fatty acids 1000 MG capsule Take 1 g by mouth daily.      Marland Kitchen glucosamine-chondroitin 500-400 MG tablet Take 2 tablets by mouth daily.      Marland Kitchen ibuprofen (ADVIL,MOTRIN) 200 MG tablet Take 200 mg by mouth as needed.      . Multiple Vitamin (MULTIVITAMIN) capsule Take 1 capsule by mouth daily.      . vitamin E 400 UNIT capsule Take 400 Units by mouth daily.      . dabigatran (PRADAXA) 150 MG CAPS Take 1 capsule (150 mg total) by mouth every 12 (twelve) hours.  60 capsule  3  . flecainide (TAMBOCOR) 100 MG tablet Take 1 tablet (100 mg total) by mouth 2 (two) times daily.  60 tablet  3    No Known Allergies  History   Social History  . Marital  Status: Married    Spouse Name: N/A    Number of Children: N/A  . Years of Education: N/A   Occupational History  . Not on file.   Social History Main Topics  . Smoking status: Never Smoker   . Smokeless tobacco: Never Used  . Alcohol Use: 3.5 oz/week    7 drink(s) per week     1-2 drinks per day  . Drug Use: No  . Sexually Active: Not on file   Other Topics Concern  . Not on file   Social History Narrative   Lives in [place]. Works as Water engineer at the [employer]    Family History  Problem Relation Age of Onset  . Stroke Mother     ROS-  All systems are reviewed and are negative except as outlined in the HPI above   Physical Exam: Filed Vitals:   12/08/11 1030  BP: 108/64  Pulse: 76  Height: 6\' 1"  (1.854 m)  Weight: 237 lb (107.502 kg)  SpO2: 97%    GEN- The patient is well appearing, alert and oriented x 3 today.   Head- normocephalic, atraumatic Eyes-  Sclera clear, conjunctiva pink Ears- hearing intact Oropharynx- clear Neck- supple, no JVP Lymph- no  cervical lymphadenopathy Lungs- Clear to ausculation bilaterally, normal work of breathing Heart- irregular rate and rhythm, no murmurs, rubs or gallops, PMI not laterally displaced GI- soft, NT, ND, + BS Extremities- no clubbing, cyanosis, or edema MS- no significant deformity or atrophy Skin- no rash or lesion Psych- euthymic mood, full affect Neuro- strength and sensation are intact  ekg today reveals afib, V rate 86 bpm,  Assessment and Plan:

## 2011-12-08 NOTE — Patient Instructions (Addendum)
Your physician recommends that you schedule a follow-up appointment in: 6 weeks with Dr Johney Frame  Your physician has recommended you make the following change in your medication:  1)Start Pradaxa 150mg  twice daily---after taking this for 21days start Flecainide 100mg  twice daily

## 2011-12-08 NOTE — Assessment & Plan Note (Signed)
Well rate controlled We discussed rate control vs rhythm control long term.  At this time, he would like to try to achieve sinus rhythm to see if he feels any symptomatic improvement. I will therefore start pradaxa 150mg  BID today.  We will obtain baseline CBC and BMET He will start flecainide 100mg  BID after he has been on pradaxa for 21 days. He will return to see me in 4-6 weeks.  IF he remains in afib, then he will require cardioversion at that point.

## 2011-12-15 ENCOUNTER — Ambulatory Visit: Payer: BC Managed Care – PPO | Admitting: Cardiology

## 2012-01-19 ENCOUNTER — Encounter: Payer: Self-pay | Admitting: *Deleted

## 2012-01-19 ENCOUNTER — Encounter: Payer: Self-pay | Admitting: Internal Medicine

## 2012-01-19 ENCOUNTER — Ambulatory Visit (INDEPENDENT_AMBULATORY_CARE_PROVIDER_SITE_OTHER): Payer: BC Managed Care – PPO | Admitting: Internal Medicine

## 2012-01-19 VITALS — BP 125/78 | HR 90 | Ht 73.0 in | Wt 243.8 lb

## 2012-01-19 DIAGNOSIS — I4892 Unspecified atrial flutter: Secondary | ICD-10-CM | POA: Insufficient documentation

## 2012-01-19 DIAGNOSIS — I4891 Unspecified atrial fibrillation: Secondary | ICD-10-CM

## 2012-01-19 NOTE — Assessment & Plan Note (Signed)
He has organized from atrial fibrillation into typical appearing atrial flutter.  At this time, we will proceed with cardioversion. Risks, benefits, and alternatives to cardioversion were discussed at length with the patient who wishes to proceed. He is adequately anticoagulation.  He will therefore undergo cardioversion at the next available time.

## 2012-01-19 NOTE — Patient Instructions (Signed)

## 2012-01-19 NOTE — Progress Notes (Signed)
PCP:  Nelwyn Salisbury, MD Primary Cardiologist:  Dr Myrtis Ser  The patient presents today for routine electrophysiology followup.  Since last being seen in our clinic, the patient reports doing very well.  He is tolerating flecainide but is now in atrial flutter. Today, he denies symptoms of palpitations, chest pain, shortness of breath, orthopnea, PND, lower extremity edema, dizziness, presyncope, syncope, or neurologic sequela.  The patient feels that he is tolerating medications without difficulties and is otherwise without complaint today.   Past Medical History  Diagnosis Date  . Allergy   . Persistent atrial fibrillation     a. Paroxysmal atrial fibrillation, April, 2013, asymptomatic;  b. 07/2011 Echo: EF 55-60%  . Ejection fraction     EF 55-60%, echo, May, 2013   Past Surgical History  Procedure Date  . Hernia repair 2000    umbilical hernia  . Wrist surgery 1979    left wrist, to drain an infection   . Tonsillectomy and adenoidectomy     Current Outpatient Prescriptions  Medication Sig Dispense Refill  . Ascorbic Acid (VITAMIN C) 1000 MG tablet Take 1,000 mg by mouth daily.      Marland Kitchen aspirin EC 81 MG tablet Take 1 tablet (81 mg total) by mouth daily.  90 tablet  3  . dabigatran (PRADAXA) 150 MG CAPS Take 1 capsule (150 mg total) by mouth every 12 (twelve) hours.  60 capsule  3  . diltiazem (CARDIZEM CD) 240 MG 24 hr capsule Take 1 capsule (240 mg total) by mouth daily.  90 capsule  3  . fish oil-omega-3 fatty acids 1000 MG capsule Take 1 g by mouth daily.      . flecainide (TAMBOCOR) 100 MG tablet Take 1 tablet (100 mg total) by mouth 2 (two) times daily.  60 tablet  3  . glucosamine-chondroitin 500-400 MG tablet Take 2 tablets by mouth daily.      Marland Kitchen ibuprofen (ADVIL,MOTRIN) 200 MG tablet Take 200 mg by mouth as needed.      . Multiple Vitamin (MULTIVITAMIN) capsule Take 1 capsule by mouth daily.      . vitamin E 400 UNIT capsule Take 400 Units by mouth daily.        No Known  Allergies  History   Social History  . Marital Status: Married    Spouse Name: N/A    Number of Children: N/A  . Years of Education: N/A   Occupational History  . Not on file.   Social History Main Topics  . Smoking status: Never Smoker   . Smokeless tobacco: Never Used  . Alcohol Use: 3.5 oz/week    7 drink(s) per week     1-2 drinks per day  . Drug Use: No  . Sexually Active: Not on file   Other Topics Concern  . 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 Stroke Mother     ROS-  All systems are reviewed and are negative except as outlined in the HPI above   Physical Exam: Filed Vitals:   01/19/12 1517  BP: 125/78  Pulse: 90  Height: 6\' 1"  (1.854 m)  Weight: 243 lb 12.8 oz (110.587 kg)    GEN- The patient is well appearing, alert and oriented x 3 today.   Head- normocephalic, atraumatic Eyes-  Sclera clear, conjunctiva pink Ears- hearing intact Oropharynx-  clear Neck- supple, no JVP Lymph- no cervical lymphadenopathy Lungs- Clear to ausculation bilaterally, normal work of breathing Heart- irregular rate and rhythm, no murmurs, rubs or gallops, PMI not laterally displaced GI- soft, NT, ND, + BS Extremities- no clubbing, cyanosis, or edema MS- no significant deformity or atrophy Skin- no rash or lesion Psych- euthymic mood, full affect Neuro- strength and sensation are intact  ekg today reveals atrial flutter, V rate 92 bpm  Assessment and Plan:

## 2012-01-19 NOTE — Assessment & Plan Note (Signed)
As above.

## 2012-01-20 LAB — CBC WITH DIFFERENTIAL/PLATELET
Basophils Absolute: 0 10*3/uL (ref 0.0–0.1)
Eosinophils Relative: 3 % (ref 0.0–5.0)
HCT: 43.5 % (ref 39.0–52.0)
Lymphs Abs: 1.4 10*3/uL (ref 0.7–4.0)
Monocytes Absolute: 0.4 10*3/uL (ref 0.1–1.0)
Monocytes Relative: 6.1 % (ref 3.0–12.0)
Neutrophils Relative %: 65.5 % (ref 43.0–77.0)
Platelets: 196 10*3/uL (ref 150.0–400.0)
RDW: 12.9 % (ref 11.5–14.6)
WBC: 5.8 10*3/uL (ref 4.5–10.5)

## 2012-01-20 LAB — BASIC METABOLIC PANEL
BUN: 16 mg/dL (ref 6–23)
Creatinine, Ser: 1 mg/dL (ref 0.4–1.5)
GFR: 87.05 mL/min (ref >60.00)
Glucose, Bld: 89 mg/dL (ref 70–99)

## 2012-01-20 LAB — MAGNESIUM: Magnesium: 2.1 mg/dL (ref 1.5–2.5)

## 2012-01-26 ENCOUNTER — Ambulatory Visit (HOSPITAL_COMMUNITY)
Admission: RE | Admit: 2012-01-26 | Discharge: 2012-01-26 | Disposition: A | Discharge status: Home / Self Care | Payer: BC Managed Care – PPO | Source: Ambulatory Visit | Attending: Cardiology | Admitting: Cardiology

## 2012-01-26 ENCOUNTER — Encounter (HOSPITAL_COMMUNITY)
Admission: RE | Disposition: A | Discharge status: Home / Self Care | Payer: Self-pay | Source: Ambulatory Visit | Attending: Cardiology

## 2012-01-26 SURGERY — CANCELLED PROCEDURE

## 2012-03-08 ENCOUNTER — Encounter: Payer: Self-pay | Admitting: Internal Medicine

## 2012-03-08 ENCOUNTER — Ambulatory Visit (INDEPENDENT_AMBULATORY_CARE_PROVIDER_SITE_OTHER): Payer: BC Managed Care – PPO | Admitting: Internal Medicine

## 2012-03-08 VITALS — BP 120/62 | HR 79 | Ht 73.0 in | Wt 240.0 lb

## 2012-03-08 DIAGNOSIS — I4891 Unspecified atrial fibrillation: Secondary | ICD-10-CM

## 2012-03-08 NOTE — Progress Notes (Signed)
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  Since last being seen in our clinic, the patient reports doing very well. He spontaneously converted to sinus rhythm. He thinks that his energy may be slightly improved with sinus.  Today, he denies symptoms of palpitations, chest pain, shortness of breath,  lower extremity edema, dizziness, presyncope, or syncope.  The patient is otherwise without complaint today.   Past Medical History  Diagnosis Date  . Allergy   . Persistent atrial fibrillation     a. Paroxysmal atrial fibrillation, April, 2013, asymptomatic;  b. 07/2011 Echo: EF 55-60%  . Ejection fraction     EF 55-60%, echo, May, 2013   Past Surgical History  Procedure Date  . Hernia repair 2000    umbilical hernia  . Wrist surgery 1979    left wrist, to drain an infection   . Tonsillectomy and adenoidectomy     Current Outpatient Prescriptions  Medication Sig Dispense Refill  . Ascorbic Acid (VITAMIN C) 1000 MG tablet Take 1,000 mg by mouth daily.      . dabigatran (PRADAXA) 150 MG CAPS Take 1 capsule (150 mg total) by mouth every 12 (twelve) hours.  60 capsule  3  . diltiazem (CARDIZEM CD) 240 MG 24 hr capsule Take 1 capsule (240 mg total) by mouth daily.  90 capsule  3  . fish oil-omega-3 fatty acids 1000 MG capsule Take 1 g by mouth daily.      . flecainide (TAMBOCOR) 100 MG tablet Take 1 tablet (100 mg total) by mouth 2 (two) times daily.  60 tablet  3  . glucosamine-chondroitin 500-400 MG tablet Take 2 tablets by mouth daily.      Marland Kitchen ibuprofen (ADVIL,MOTRIN) 200 MG tablet Take 200 mg by mouth as needed.      . Multiple Vitamin (MULTIVITAMIN) capsule Take 1 capsule by mouth daily.      . vitamin E 400 UNIT capsule Take 400 Units by mouth daily.        Physical Exam: Filed Vitals:   03/08/12 1004  BP: 120/62  Pulse: 79  Height: 6\' 1"  (1.854 m)  Weight: 240 lb (108.863 kg)     GEN- The patient is well appearing, alert and oriented x 3 today.   Head- normocephalic, atraumatic Eyes-  Sclera clear, conjunctiva pink Ears- hearing intact Oropharynx- clear Lungs- Clear to ausculation bilaterally, normal work of breathing Heart- Regular rate and rhythm with frequent ectopy, no murmurs, rubs or gallops, PMI not laterally displaced GI- soft, NT, ND, + BS Extremities- no clubbing, cyanosis, or edema  ekg today reveals sinus rhythm with frequent PACs and a single PVC, PR 170, QRS 98, Qtc 449, otherwise normal ekg  Assessment and Plan:

## 2012-03-08 NOTE — Assessment & Plan Note (Signed)
Now in sinus rhythm with flecainide CHADSVASC score is 0.  We will stop pradaxa today.  He will need a GXT on flecainide to evaluate for exercise induced arrhythmias.  We will order this for early January per his requests.

## 2012-03-08 NOTE — Patient Instructions (Signed)
Your physician wants you to follow-up in: 6 months with Dr Jacquiline Doe will receive a reminder letter in the mail two months in advance. If you don't receive a letter, please call our office to schedule the follow-up appointment.  Your physician has requested that you have an exercise tolerance test. For further information please visit [email or web address] Please also follow instruction sheet, as given.---after the first of the year

## 2012-03-22 ENCOUNTER — Ambulatory Visit (INDEPENDENT_AMBULATORY_CARE_PROVIDER_SITE_OTHER): Payer: BC Managed Care – PPO | Admitting: Nurse Practitioner

## 2012-03-22 ENCOUNTER — Encounter: Payer: Self-pay | Admitting: Nurse Practitioner

## 2012-03-22 VITALS — BP 135/80 | HR 75

## 2012-03-22 DIAGNOSIS — I4891 Unspecified atrial fibrillation: Secondary | ICD-10-CM

## 2012-03-22 NOTE — Progress Notes (Signed)
Exercise Treadmill Test  Pre-Exercise Testing Evaluation Rhythm: Sinus with PAC's Rate: 75                 Test  Exercise Tolerance Test Ordering MD: Hillis Range, MD  Interpreting MD: Norma Fredrickson, NP  Unique Test No: 1   Treadmill:  1  Indication for ETT: F/U flecainide  Contraindication to ETT: No   Stress Modality: exercise - treadmill  Cardiac Imaging Performed: non   Protocol: standard Bruce - maximal  Max BP:  189/71  Max MPHR (bpm):  173 85% MPR (bpm):  147  MPHR obtained (bpm):  150 % MPHR obtained:  86%  Reached 85% MPHR (min:sec):  10:292 Total Exercise Time (min-sec):  11 minutes  Workload in METS:  13.2 Borg Scale: 15  Reason ETT Terminated:  patient's desire to stop    ST Segment Analysis At Rest: normal ST segments - no evidence of significant ST depression With Exercise: no evidence of significant ST depression  Other Information Arrhythmia:  No Angina during ETT:  absent (0) Quality of ETT:  diagnostic  ETT Interpretation:  normal - no evidence of ischemia by ST analysis  Comments: Patient presents today for routine GXT. Has been started on Flecainide for PAF. He is currently doing well. Has no complaints. Rhythm has been good. Tolerating his medicines.  Today, he exercised on the standard Bruce protocol for 11 minutes. Excellent exercise tolerance. Adequate blood pressure response. Clinically negative. EKG negative for ischemia. PAC's noted prior to stress testing and in recovery. No ventricular arrhythmia. No atrial fib noted.   Recommendations: Continue current medicines Ok to exercise Has follow up with Dr. Johney Frame in 6 months.   Patient is agreeable to this plan and will call if any problems develop in the interim.

## 2012-04-23 ENCOUNTER — Other Ambulatory Visit: Payer: Self-pay | Admitting: Emergency Medicine

## 2012-04-23 MED ORDER — FLECAINIDE ACETATE 100 MG PO TABS: 100.0000 mg | ORAL_TABLET | Freq: Two times a day (BID) | ORAL | Status: DC

## 2012-07-12 ENCOUNTER — Ambulatory Visit (INDEPENDENT_AMBULATORY_CARE_PROVIDER_SITE_OTHER): Payer: BC Managed Care – PPO | Admitting: Family Medicine

## 2012-07-12 ENCOUNTER — Encounter: Payer: Self-pay | Admitting: Family Medicine

## 2012-07-12 VITALS — BP 130/80 | HR 104 | Temp 97.9°F | Wt 236.0 lb

## 2012-07-12 DIAGNOSIS — I4891 Unspecified atrial fibrillation: Secondary | ICD-10-CM

## 2012-07-12 DIAGNOSIS — J019 Acute sinusitis, unspecified: Secondary | ICD-10-CM

## 2012-07-12 MED ORDER — METHYLPREDNISOLONE ACETATE 80 MG/ML IJ SUSP
120.0000 mg | Freq: Once | INTRAMUSCULAR | Status: AC
  Administered 2012-07-12: 120 mg via INTRAMUSCULAR

## 2012-07-12 MED ORDER — AMOXICILLIN-POT CLAVULANATE 875-125 MG PO TABS: 1.0000 | ORAL_TABLET | Freq: Two times a day (BID) | ORAL | Status: DC

## 2012-07-12 NOTE — Progress Notes (Signed)
  Subjective:    Patient ID: Shawn Hurley, male    DOB: 02-11-1966, 47 y.o.   MRN: [id number]  HPI Here for 5 days of sinus pressure, PND, ST, and a dry cough. No fever.    Review of Systems  Constitutional: Negative.   HENT: Positive for congestion, postnasal drip and sinus pressure.   Eyes: Negative.   Respiratory: Positive for cough. Negative for shortness of breath and wheezing.   Cardiovascular: Negative.        Objective:   Physical Exam  Constitutional: He appears well-developed and well-nourished. No distress.  HENT:  Right Ear: External ear normal.  Left Ear: External ear normal.  Nose: Nose normal.  Mouth/Throat: Oropharynx is clear and moist.  Eyes: Conjunctivae are normal.  Cardiovascular: Normal rate, normal heart sounds and intact distal pulses.   Occasional ectopy   Pulmonary/Chest: Effort normal and breath sounds normal.  Lymphadenopathy:    He has no cervical adenopathy.          Assessment & Plan:  Recheck prn. He is due to follow up with Dr. Johney Frame in 3 weeks

## 2012-07-12 NOTE — Addendum Note (Signed)
Addended by: Azucena Freed on: 07/12/2012 01:39 PM   Modules accepted: Orders

## 2012-08-18 ENCOUNTER — Encounter: Payer: Self-pay | Admitting: Internal Medicine

## 2012-08-18 ENCOUNTER — Ambulatory Visit (INDEPENDENT_AMBULATORY_CARE_PROVIDER_SITE_OTHER): Payer: BC Managed Care – PPO | Admitting: Internal Medicine

## 2012-08-18 VITALS — BP 120/72 | HR 70 | Ht 73.0 in | Wt 234.4 lb

## 2012-08-18 DIAGNOSIS — I4891 Unspecified atrial fibrillation: Secondary | ICD-10-CM

## 2012-08-18 NOTE — Progress Notes (Signed)
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  Since last being seen in our clinic, the patient reports doing very well.  His exercise tolerance remains improved with sinus rhythm.  He is unaware of any afib.  Today, he denies symptoms of palpitations, chest pain, shortness of breath,  lower extremity edema, dizziness, presyncope, or syncope.  The patient is otherwise without complaint today.   Past Medical History  Diagnosis Date  . Allergy   . Persistent atrial fibrillation     a. Paroxysmal atrial fibrillation, April, 2013, asymptomatic;  b. 07/2011 Echo: EF 55-60%  . Ejection fraction     EF 55-60%, echo, May, 2013  . High risk medication use     Flecainide   Past Surgical History  Procedure Laterality Date  . Hernia repair  2000    umbilical hernia  . Wrist surgery  1979    left wrist, to drain an infection   . Tonsillectomy and adenoidectomy      Current Outpatient Prescriptions  Medication Sig Dispense Refill  . Ascorbic Acid (VITAMIN C) 1000 MG tablet Take 1,000 mg by mouth daily.      Marland Kitchen aspirin 81 MG tablet Take 81 mg by mouth daily.      Marland Kitchen diltiazem (CARDIZEM CD) 240 MG 24 hr capsule Take 1 capsule (240 mg total) by mouth daily.  90 capsule  3  . fish oil-omega-3 fatty acids 1000 MG capsule Take 1 g by mouth daily.      . flecainide (TAMBOCOR) 100 MG tablet Take 1 tablet (100 mg total) by mouth 2 (two) times daily.  60 tablet  5  . glucosamine-chondroitin 500-400 MG tablet Take 2 tablets by mouth daily.      Marland Kitchen ibuprofen (ADVIL,MOTRIN) 200 MG tablet Take 200 mg by mouth as needed.      . Multiple Vitamin (MULTIVITAMIN) capsule Take 1 capsule by mouth daily.      . vitamin E 400 UNIT capsule Take 400 Units by mouth daily.       No current facility-administered medications for this visit.    Physical Exam: Filed Vitals:   08/18/12 1004  BP: 120/72  Pulse: 70  Height: 6'  1" (1.854 m)  Weight: 234 lb 6.4 oz (106.323 kg)    GEN- The patient is well appearing, alert and oriented x 3 today.   Head- normocephalic, atraumatic Eyes-  Sclera clear, conjunctiva pink Ears- hearing intact Oropharynx- clear Lungs- Clear to ausculation bilaterally, normal work of breathing Heart- Regular rate and rhythm with frequent ectopy, no murmurs, rubs or gallops, PMI not laterally displaced GI- soft, NT, ND, + BS Extremities- no clubbing, cyanosis, or edema  ekg today reveals sinus rhythm with PACs 70 bpm, PR 184, QRS 96, Qtc 438, otherwise normal ekg  Assessment and Plan:  1. afib Stable No change required today  Return in 9 months

## 2012-08-18 NOTE — Patient Instructions (Signed)
Your physician wants you to follow-up in: 9 months with Dr Allred You will receive a reminder letter in the mail two months in advance. If you don't receive a letter, please call our office to schedule the follow-up appointment.  

## 2012-09-26 ENCOUNTER — Other Ambulatory Visit: Payer: Self-pay | Admitting: Internal Medicine

## 2012-09-27 ENCOUNTER — Other Ambulatory Visit: Payer: Self-pay

## 2012-09-28 ENCOUNTER — Other Ambulatory Visit: Payer: Self-pay

## 2012-09-28 MED ORDER — DILTIAZEM HCL ER COATED BEADS 240 MG PO CP24: 240.0000 mg | ORAL_CAPSULE | Freq: Every day | ORAL | Status: DC

## 2012-10-25 ENCOUNTER — Other Ambulatory Visit: Payer: Self-pay | Admitting: Internal Medicine

## 2013-02-03 ENCOUNTER — Ambulatory Visit (INDEPENDENT_AMBULATORY_CARE_PROVIDER_SITE_OTHER): Payer: BC Managed Care – PPO | Admitting: Family Medicine

## 2013-02-03 ENCOUNTER — Encounter: Payer: Self-pay | Admitting: Family Medicine

## 2013-02-03 VITALS — BP 126/80 | HR 86 | Temp 99.0°F | Wt 246.0 lb

## 2013-02-03 DIAGNOSIS — J019 Acute sinusitis, unspecified: Secondary | ICD-10-CM

## 2013-02-03 DIAGNOSIS — J020 Streptococcal pharyngitis: Secondary | ICD-10-CM

## 2013-02-03 MED ORDER — CEFUROXIME AXETIL 500 MG PO TABS: 500.0000 mg | ORAL_TABLET | Freq: Two times a day (BID) | ORAL | Status: DC

## 2013-02-03 NOTE — Progress Notes (Signed)
  Subjective:    Patient ID: Shawn Hurley, male    DOB: 11/15/1965, 47 y.o.   MRN: [id number]  HPI Here for 5 days of facial pressure and sinus pain, PND, ST,and a dry cough. No fever.    Review of Systems  Constitutional: Negative.   HENT: Positive for congestion, postnasal drip and sinus pressure.   Eyes: Negative.   Respiratory: Positive for cough.        Objective:   Physical Exam  Constitutional: He appears well-developed and well-nourished.  hoarse  HENT:  Right Ear: External ear normal.  Left Ear: External ear normal.  Nose: Nose normal.  Mouth/Throat: Oropharynx is clear and moist. No oropharyngeal exudate.  Eyes: Conjunctivae are normal.  Pulmonary/Chest: Effort normal and breath sounds normal.  Lymphadenopathy:    He has no cervical adenopathy.          Assessment & Plan:  Given Ceftin and add Mucinex.

## 2013-02-03 NOTE — Progress Notes (Signed)
Pre visit review using our clinic review tool, if applicable. No additional management support is needed unless otherwise documented below in the visit note. 

## 2013-03-21 ENCOUNTER — Encounter: Payer: Self-pay | Admitting: Internal Medicine

## 2013-03-21 ENCOUNTER — Ambulatory Visit (INDEPENDENT_AMBULATORY_CARE_PROVIDER_SITE_OTHER): Payer: BC Managed Care – PPO | Admitting: Internal Medicine

## 2013-03-21 ENCOUNTER — Ambulatory Visit (INDEPENDENT_AMBULATORY_CARE_PROVIDER_SITE_OTHER)
Admission: RE | Admit: 2013-03-21 | Discharge: 2013-03-21 | Disposition: A | Discharge status: Home / Self Care | Payer: BC Managed Care – PPO | Source: Ambulatory Visit | Attending: Internal Medicine | Admitting: Internal Medicine

## 2013-03-21 VITALS — BP 114/60 | HR 87 | Temp 98.0°F | Wt 243.0 lb

## 2013-03-21 DIAGNOSIS — J22 Unspecified acute lower respiratory infection: Secondary | ICD-10-CM

## 2013-03-21 DIAGNOSIS — J988 Other specified respiratory disorders: Secondary | ICD-10-CM

## 2013-03-21 DIAGNOSIS — J189 Pneumonia, unspecified organism: Secondary | ICD-10-CM

## 2013-03-21 MED ORDER — AZITHROMYCIN 250 MG PO TABS: 250.0000 mg | ORAL_TABLET | ORAL | Status: DC

## 2013-03-21 MED ORDER — HYDROCODONE-HOMATROPINE 5-1.5 MG/5ML PO SYRP: 5.0000 mL | ORAL_SOLUTION | ORAL | Status: DC | PRN

## 2013-03-21 MED ORDER — AMOXICILLIN 500 MG PO TABS: 1000.0000 mg | ORAL_TABLET | Freq: Three times a day (TID) | ORAL | Status: DC

## 2013-03-21 MED ORDER — CEFTRIAXONE SODIUM 1 G IJ SOLR
1.0000 g | Freq: Once | INTRAMUSCULAR | Status: AC
  Administered 2013-03-21: 1 g via INTRAMUSCULAR

## 2013-03-21 NOTE — Progress Notes (Signed)
   Subjective:    Patient ID: Shawn Hurley, male    DOB: [birth year], 47 y.o.   MRN: [id number]  HPI    Review of Systems     Objective:   Physical Exam        Assessment & Plan:

## 2013-03-21 NOTE — Progress Notes (Signed)
Chief Complaint  Patient presents with  . Fever    Started Christmas Eve.  Cough is productive of a light green sputum.  Rotating Tylenol and Advil.  Also taking Mucinex.  Has taken Tylenol two hours ago.  . Generalized Body Aches  . Cough  . Dizziness    HPI: Patient comes in today for SDA for  new problem evaluation. PCP NASee above  Onset with high fever 103  body aches and cough 12 24 t Family has been sick and daughter had PNA sx.   Thought  achiness and cough .  was getting better   Then fever and cough increasing  Again  Still with fever 101 range.  Taking tylenol  Advil.   . Not a lot of ur congestion    Was on antibiotic recently for ? Ur   Off at least a month.  Has shortness of breath and doe  Walking dog  New . No cp coughing speels  No hemoptysis   ROS: See pertinent positives and negatives per HPI. Has  Af and arrythmia  Under caer on meds  No racing heart noted at this time  No hx of under lying lung disease   Past Medical History  Diagnosis Date  . Allergy   . Persistent atrial fibrillation     a. Paroxysmal atrial fibrillation, April, 2013, asymptomatic;  b. 07/2011 Echo: EF 55-60%  . Ejection fraction     EF 55-60%, echo, May, 2013  . High risk medication use     Flecainide    Family History  Problem Relation Age of Onset  . Stroke Mother     History   Social History  . Marital Status: Married    Spouse Name: N/A    Number of Children: N/A  . Years of Education: N/A   Social History Main Topics  . Smoking status: Never Smoker   . Smokeless tobacco: Never Used  . Alcohol Use: 3.5 oz/week    7 drink(s) per week     Comment: 1-2 drinks per day  . Drug Use: No  . Sexual Activity: Yes   Other Topics Concern  . None   Social History Narrative   Lives in [place]. Works as Water engineer at the [employer] Prescriptions as of 03/21/2013  Medication Sig  . Ascorbic Acid (VITAMIN C) 1000 MG tablet Take 1,000 mg by  mouth daily.  Marland Kitchen aspirin 81 MG tablet Take 81 mg by mouth daily.  Marland Kitchen diltiazem (CARDIZEM CD) 240 MG 24 hr capsule Take 1 capsule (240 mg total) by mouth daily.  . fish oil-omega-3 fatty acids 1000 MG capsule Take 1 g by mouth daily.  . flecainide (TAMBOCOR) 100 MG tablet TAKE 1 TABLET BY MOUTH TWICE DAILY  . glucosamine-chondroitin 500-400 MG tablet Take 2 tablets by mouth daily.  Marland Kitchen ibuprofen (ADVIL,MOTRIN) 200 MG tablet Take 200 mg by mouth as needed.  . Multiple Vitamin (MULTIVITAMIN) capsule Take 1 capsule by mouth daily.  . vitamin E 400 UNIT capsule Take 400 Units by mouth daily.  Marland Kitchen amoxicillin (AMOXIL) 500 MG tablet Take 2 tablets (1,000 mg total) by mouth 3 (three) times daily. For pneumonia  . azithromycin (ZITHROMAX Z-PAK) 250 MG tablet Take 1 tablet (250 mg total) by mouth as directed. Take 2 po first day, then 1 po qd  . HYDROcodone-homatropine (HYCODAN) 5-1.5 MG/5ML syrup Take 5 mLs by mouth every 4 (four) hours as needed for cough.  . [DISCONTINUED]  cefUROXime (CEFTIN) 500 MG tablet Take 1 tablet (500 mg total) by mouth 2 (two) times daily with a meal.  . [EXPIRED] cefTRIAXone (ROCEPHIN) injection 1 g     EXAM:  BP 114/60  Pulse 87  Temp(Src) 98 F (36.7 C) (Oral)  Wt 243 lb (110.224 kg)  SpO2 97%  Body mass index is 32.07 kg/(m^2). WDWN in NAD  quiet respirations; mildly congested  . Non toxic . HEENT: Normocephalic ;atraumatic , Eyes;  PERRL, EOMs  Full, lids and conjunctiva clear,,Ears: no deformities, canals nl, TM landmarks normal, Nose: no deformity or discharge but congested;face non  tender Mouth : OP clear without lesion or edema . Neck: Supple without adenopathy or masses or bruits Chest:   Bilateral crackles rales at bases   No rubs no retractions  CV:  S1-S2 no gallops or murmurs peripheral perfusion is normal Skin :nl perfusion and no acute rashes  MS: moves all extremities without noticeable focal  abnormality PSYCH: pleasant and cooperative, no obvious  depression or anxiety  ASSESSMENT AND PLAN:  Discussed the following assessment and plan:  CAP (community acquired pneumonia) - initially flu like illness at onset - Plan: DG Chest 2 View, cefTRIAXone (ROCEPHIN) injection 1 g  Acute respiratory infection - Plan: DG Chest 2 View, cefTRIAXone (ROCEPHIN) injection 1 g Suspect pneumonia whether secondary or primary 6 days of fever relapse after some improvement. Rocephin given in office sent for chest x-ray noted to be consistent with exam. Given high-dose amoxicillin and azithromycin. Consider Levaquin but avoiding quinolones of possible because of his antiarrhythmic medication and risk of QT interval may be more pronounced with quinolones. -Patient advised to return or notify health care team  if symptoms worsen or persist or new concerns arise.  Patient Instructions  You lung exam is consistent with  Possible pneumonia with tthe prolonged fever . Injection of antibiotic and get an x ray asap . We will contact you about results and further antibiotic rx and follow up. NO work until fever is gone and you are feeling better .    Pneumonia, Adult Pneumonia is an infection of the lungs.  CAUSES Pneumonia may be caused by bacteria or a virus. Usually, these infections are caused by breathing infectious particles into the lungs (respiratory tract). SYMPTOMS   Cough.  Fever.  Chest pain.  Increased rate of breathing.  Wheezing.  Mucus production. DIAGNOSIS  If you have the common symptoms of pneumonia, your caregiver will typically confirm the diagnosis with a chest X-ray. The X-ray will show an abnormality in the lung (pulmonary infiltrate) if you have pneumonia. Other tests of your blood, urine, or sputum may be done to find the specific cause of your pneumonia. Your caregiver may also do tests (blood gases or pulse oximetry) to see how well your lungs are working. TREATMENT  Some forms of pneumonia may be spread to other people when  you cough or sneeze. You may be asked to wear a mask before and during your exam. Pneumonia that is caused by bacteria is treated with antibiotic medicine. Pneumonia that is caused by the influenza virus may be treated with an antiviral medicine. Most other viral infections must run their course. These infections will not respond to antibiotics.  PREVENTION A pneumococcal shot (vaccine) is available to prevent a common bacterial cause of pneumonia. This is usually suggested for:  People over 87 years old.  Patients on chemotherapy.  People with chronic lung problems, such as bronchitis or emphysema.  People with  immune system problems. If you are over 65 or have a high risk condition, you may receive the pneumococcal vaccine if you have not received it before. In some countries, a routine influenza vaccine is also recommended. This vaccine can help prevent some cases of pneumonia.You may be offered the influenza vaccine as part of your care. If you smoke, it is time to quit. You may receive instructions on how to stop smoking. Your caregiver can provide medicines and counseling to help you quit. HOME CARE INSTRUCTIONS   Cough suppressants may be used if you are losing too much rest. However, coughing protects you by clearing your lungs. You should avoid using cough suppressants if you can.  Your caregiver may have prescribed medicine if he or she thinks your pneumonia is caused by a bacteria or influenza. Finish your medicine even if you start to feel better.  Your caregiver may also prescribe an expectorant. This loosens the mucus to be coughed up.  Only take over-the-counter or prescription medicines for pain, discomfort, or fever as directed by your caregiver.  Do not smoke. Smoking is a common cause of bronchitis and can contribute to pneumonia. If you are a smoker and continue to smoke, your cough may last several weeks after your pneumonia has cleared.  A cold steam vaporizer or  humidifier in your room or home may help loosen mucus.  Coughing is often worse at night. Sleeping in a semi-upright position in a recliner or using a couple pillows under your head will help with this.  Get rest as you feel it is needed. Your body will usually let you know when you need to rest. SEEK IMMEDIATE MEDICAL CARE IF:   Your illness becomes worse. This is especially true if you are elderly or weakened from any other disease.  You cannot control your cough with suppressants and are losing sleep.  You begin coughing up blood.  You develop pain which is getting worse or is uncontrolled with medicines.  You have a fever.  Any of the symptoms which initially brought you in for treatment are getting worse rather than better.  You develop shortness of breath or chest pain. MAKE SURE YOU:   Understand these instructions.  Will watch your condition.  Will get help right away if you are not doing well or get worse. Document Released: 03/10/2005 Document Revised: 06/02/2011 Document Reviewed: 05/30/2010 [facility] Patient Information 2014 [place], [place].         Neta Mends. Panosh M.D.  Pre visit review using our clinic review tool, if applicable. No additional management support is needed unless otherwise documented below in the visit note.

## 2013-03-21 NOTE — Patient Instructions (Signed)
You lung exam is consistent with  Possible pneumonia with tthe prolonged fever . Injection of antibiotic and get an x ray asap . We will contact you about results and further antibiotic rx and follow up. NO work until fever is gone and you are feeling better .    Pneumonia, Adult Pneumonia is an infection of the lungs.  CAUSES Pneumonia may be caused by bacteria or a virus. Usually, these infections are caused by breathing infectious particles into the lungs (respiratory tract). SYMPTOMS   Cough.  Fever.  Chest pain.  Increased rate of breathing.  Wheezing.  Mucus production. DIAGNOSIS  If you have the common symptoms of pneumonia, your caregiver will typically confirm the diagnosis with a chest X-ray. The X-ray will show an abnormality in the lung (pulmonary infiltrate) if you have pneumonia. Other tests of your blood, urine, or sputum may be done to find the specific cause of your pneumonia. Your caregiver may also do tests (blood gases or pulse oximetry) to see how well your lungs are working. TREATMENT  Some forms of pneumonia may be spread to other people when you cough or sneeze. You may be asked to wear a mask before and during your exam. Pneumonia that is caused by bacteria is treated with antibiotic medicine. Pneumonia that is caused by the influenza virus may be treated with an antiviral medicine. Most other viral infections must run their course. These infections will not respond to antibiotics.  PREVENTION A pneumococcal shot (vaccine) is available to prevent a common bacterial cause of pneumonia. This is usually suggested for:  People over 68 years old.  Patients on chemotherapy.  People with chronic lung problems, such as bronchitis or emphysema.  People with immune system problems. If you are over 65 or have a high risk condition, you may receive the pneumococcal vaccine if you have not received it before. In some countries, a routine influenza vaccine is also  recommended. This vaccine can help prevent some cases of pneumonia.You may be offered the influenza vaccine as part of your care. If you smoke, it is time to quit. You may receive instructions on how to stop smoking. Your caregiver can provide medicines and counseling to help you quit. HOME CARE INSTRUCTIONS   Cough suppressants may be used if you are losing too much rest. However, coughing protects you by clearing your lungs. You should avoid using cough suppressants if you can.  Your caregiver may have prescribed medicine if he or she thinks your pneumonia is caused by a bacteria or influenza. Finish your medicine even if you start to feel better.  Your caregiver may also prescribe an expectorant. This loosens the mucus to be coughed up.  Only take over-the-counter or prescription medicines for pain, discomfort, or fever as directed by your caregiver.  Do not smoke. Smoking is a common cause of bronchitis and can contribute to pneumonia. If you are a smoker and continue to smoke, your cough may last several weeks after your pneumonia has cleared.  A cold steam vaporizer or humidifier in your room or home may help loosen mucus.  Coughing is often worse at night. Sleeping in a semi-upright position in a recliner or using a couple pillows under your head will help with this.  Get rest as you feel it is needed. Your body will usually let you know when you need to rest. SEEK IMMEDIATE MEDICAL CARE IF:   Your illness becomes worse. This is especially true if you are elderly or weakened  from any other disease.  You cannot control your cough with suppressants and are losing sleep.  You begin coughing up blood.  You develop pain which is getting worse or is uncontrolled with medicines.  You have a fever.  Any of the symptoms which initially brought you in for treatment are getting worse rather than better.  You develop shortness of breath or chest pain. MAKE SURE YOU:   Understand these  instructions.  Will watch your condition.  Will get help right away if you are not doing well or get worse. Document Released: 03/10/2005 Document Revised: 06/02/2011 Document Reviewed: 05/30/2010 [facility] Patient Information 2014 [place], [place].

## 2013-03-30 ENCOUNTER — Ambulatory Visit (INDEPENDENT_AMBULATORY_CARE_PROVIDER_SITE_OTHER): Payer: BC Managed Care – PPO | Admitting: Family Medicine

## 2013-03-30 ENCOUNTER — Encounter: Payer: Self-pay | Admitting: Family Medicine

## 2013-03-30 VITALS — BP 114/76 | HR 100 | Temp 98.4°F | Wt 238.0 lb

## 2013-03-30 DIAGNOSIS — J189 Pneumonia, unspecified organism: Secondary | ICD-10-CM

## 2013-03-30 DIAGNOSIS — J181 Lobar pneumonia, unspecified organism: Principal | ICD-10-CM

## 2013-03-30 NOTE — Progress Notes (Signed)
Pre visit review using our clinic review tool, if applicable. No additional management support is needed unless otherwise documented below in the visit note. 

## 2013-03-30 NOTE — Progress Notes (Signed)
   Subjective:    Patient ID: Shawn Hurley, male    DOB: 11/27/1965, 48 y.o.   MRN: [id number]  HPI Here to follow up on pneumonia. He has been taking Ceftin and Doxycycline, and he feels much better. He still has a slight dry cough but he is not SOB. He went back to work for 8 hours yesterday.    Review of Systems  Constitutional: Positive for fatigue. Negative for fever.  HENT: Negative.   Eyes: Negative.   Respiratory: Positive for cough. Negative for shortness of breath and wheezing.   Cardiovascular: Negative.        Objective:   Physical Exam  Constitutional: He appears well-developed and well-nourished.  Cardiovascular: Normal rate, regular rhythm, normal heart sounds and intact distal pulses.   Pulmonary/Chest: Effort normal and breath sounds normal. No respiratory distress. He has no wheezes. He has no rales.          Assessment & Plan:  Resolving RLL penumonia. Recheck prn

## 2013-05-16 ENCOUNTER — Encounter (INDEPENDENT_AMBULATORY_CARE_PROVIDER_SITE_OTHER): Payer: Self-pay

## 2013-05-16 ENCOUNTER — Ambulatory Visit (INDEPENDENT_AMBULATORY_CARE_PROVIDER_SITE_OTHER): Payer: BC Managed Care – PPO | Admitting: Internal Medicine

## 2013-05-16 ENCOUNTER — Encounter: Payer: Self-pay | Admitting: Internal Medicine

## 2013-05-16 VITALS — BP 102/64 | HR 69 | Ht 73.0 in | Wt 238.8 lb

## 2013-05-16 DIAGNOSIS — I4891 Unspecified atrial fibrillation: Secondary | ICD-10-CM

## 2013-05-16 DIAGNOSIS — I4892 Unspecified atrial flutter: Secondary | ICD-10-CM

## 2013-05-16 NOTE — Progress Notes (Signed)
PCP: Nelwyn SalisburyFRY,STEPHEN A, MD Primary Cardiologist:  [name]  Shawn Hurley is a 48 y.o. male who presents today for routine electrophysiology followup.  Since last being seen in our clinic, the patient reports doing very well.  His exercise tolerance remains improved.m.  He is unaware of any afib or atrial flutter.  Today, he denies symptoms of palpitations, chest pain, shortness of breath,  lower extremity edema, dizziness, presyncope, or syncope.  The patient is otherwise without complaint today.   Past Medical History  Diagnosis Date  . Allergy   . Persistent atrial fibrillation     a. Paroxysmal atrial fibrillation, April, 2013, asymptomatic;  b. 07/2011 Echo: EF 55-60%  . Ejection fraction     EF 55-60%, echo, May, 2013  . High risk medication use     Flecainide   Past Surgical History  Procedure Laterality Date  . Hernia repair  2000    umbilical hernia  . Wrist surgery  1979    left wrist, to drain an infection   . Tonsillectomy and adenoidectomy      Current Outpatient Prescriptions  Medication Sig Dispense Refill  . Ascorbic Acid (VITAMIN C) 1000 MG tablet Take 1,000 mg by mouth daily.      Marland Kitchen. aspirin 81 MG tablet Take 81 mg by mouth daily.      Marland Kitchen. diltiazem (CARDIZEM CD) 240 MG 24 hr capsule Take 1 capsule (240 mg total) by mouth daily.  90 capsule  3  . fish oil-omega-3 fatty acids 1000 MG capsule Take 1 g by mouth daily.      . flecainide (TAMBOCOR) 100 MG tablet TAKE 1 TABLET BY MOUTH TWICE DAILY  60 tablet  6  . glucosamine-chondroitin 500-400 MG tablet Take 2 tablets by mouth daily.      Marland Kitchen. ibuprofen (ADVIL,MOTRIN) 200 MG tablet Take 200 mg by mouth as needed.      . Multiple Vitamin (MULTIVITAMIN) capsule Take 1 capsule by mouth daily.      . vitamin E 400 UNIT capsule Take 400 Units by mouth daily.       No current facility-administered medications for this visit.    Physical Exam: Filed Vitals:   05/16/13 1000  BP: 102/64  Pulse: 69  Height: 6\' 1"  (1.854 m)   Weight: 238 lb 12.8 oz (108.319 kg)    GEN- The patient is well appearing, alert and oriented x 3 today.   Head- normocephalic, atraumatic Eyes-  Sclera clear, conjunctiva pink Ears- hearing intact Oropharynx- clear Lungs- Clear to ausculation bilaterally, normal work of breathing Heart- irregular rate and rhythm with frequent ectopy, no murmurs, rubs or gallops, PMI not laterally displaced GI- soft, NT, ND, + BS Extremities- no clubbing, cyanosis, or edema  ekg today reveals typical appearing atrial flutter with variable conduction  Assessment and Plan:  1. afib Stable No change required today chads2vasc score is 0.  2. Atrial flutter (new diagnosis) He is unaware of this I will check a peak flecainide level today to see if we have room for increasing our dose if he remains in atrial flutter  Return in 1 week for an ekg.  If in sinus then I will see him in 2 months If he remains in atrial flutter then he will likely need anticoagulation and consideration of ablation.

## 2013-05-16 NOTE — Patient Instructions (Signed)
Your physician recommends that you schedule a follow-up appointment in: 1 week nurse room EKG and 2 months with Dr Johney FrameAllred  Your physician recommends that you return for lab work today: Flecainide level

## 2013-05-19 ENCOUNTER — Other Ambulatory Visit: Payer: Self-pay | Admitting: Internal Medicine

## 2013-05-21 LAB — FLECAINIDE LEVEL: Flecainide: 0.4 ug/mL (ref 0.20–1.00)

## 2013-05-24 ENCOUNTER — Ambulatory Visit (INDEPENDENT_AMBULATORY_CARE_PROVIDER_SITE_OTHER): Payer: BC Managed Care – PPO | Admitting: *Deleted

## 2013-05-24 DIAGNOSIS — I4892 Unspecified atrial flutter: Secondary | ICD-10-CM

## 2013-05-24 MED ORDER — FLECAINIDE ACETATE 150 MG PO TABS: 150.0000 mg | ORAL_TABLET | Freq: Two times a day (BID) | ORAL | Status: DC

## 2013-05-24 NOTE — Progress Notes (Signed)
Pt ambulated to triage room without assist. No signs of distress, denies SOB. EKG done/ pt continues to be in aflutter @70  BPM. DOD Dr Antoine PocheHochrein reviewed EKG. Discussed with Eula ListenKelly Rn for Dr Johney FrameAllred, since pt will have his flecainide increased today to 150 mg BID we will reschedule a nurse visit/EKG in 1 week. I left a msg for the pt to come back next week for nurse visit/ told him to call if unable to keep. I will forward to Dr Allred/Kelly Rn to follow.

## 2013-05-31 ENCOUNTER — Ambulatory Visit (INDEPENDENT_AMBULATORY_CARE_PROVIDER_SITE_OTHER): Payer: BC Managed Care – PPO | Admitting: *Deleted

## 2013-05-31 VITALS — BP 102/78 | HR 65 | Wt 239.1 lb

## 2013-05-31 DIAGNOSIS — I4891 Unspecified atrial fibrillation: Secondary | ICD-10-CM

## 2013-05-31 NOTE — Progress Notes (Signed)
In for 1 week nurse visit for EKG.  Pt alert and oriented to time and place.  Very pleasant man. Skin warm and dry to touch. States since increasing Flecainide to 150 mg BID has experienced some dizziness when changes positions. EKG done.  Reviewed with Dr. Ladona Ridgelaylor. States NSR and does not need cardioversion at this time.  He is scheduled to see Dr. Johney FrameAllred in 1 1/2 mo.  Advised pt that he will not have a cardioversion now but to continue to monitor his dizziness and to call Anselm PancoastKelly Lanier,RN if symptoms persist. He verbalizes understanding and will call if necessary.

## 2013-06-03 ENCOUNTER — Ambulatory Visit (INDEPENDENT_AMBULATORY_CARE_PROVIDER_SITE_OTHER): Payer: BC Managed Care – PPO | Admitting: Podiatrist

## 2013-06-03 ENCOUNTER — Ambulatory Visit (INDEPENDENT_AMBULATORY_CARE_PROVIDER_SITE_OTHER): Payer: BC Managed Care – PPO

## 2013-06-03 ENCOUNTER — Encounter: Payer: Self-pay | Admitting: Podiatrist

## 2013-06-03 VITALS — BP 121/67 | HR 84 | Resp 12

## 2013-06-03 DIAGNOSIS — Q6650 Congenital pes planus, unspecified foot: Secondary | ICD-10-CM

## 2013-06-03 DIAGNOSIS — R52 Pain, unspecified: Secondary | ICD-10-CM

## 2013-06-03 DIAGNOSIS — M722 Plantar fascial fibromatosis: Secondary | ICD-10-CM

## 2013-06-03 NOTE — Progress Notes (Signed)
   Subjective:    Patient ID: Shawn Hurley, male    DOB: 09/03/1965, 48 y.o.   MRN: [id number]  HPI Shawn Hurley presents today for pain in his right heel. He states that he's been diagnosed with plantar fasciitis in the past and was [place]iat[place]. He has been utilizing custom orthotics and recently began having pain in his heel despite the orthotics. He's been stretching the feet and utilizing a tennis ball with some relief in symptoms. He also is interested in orthotics.  PT STATED RT FOOT HEEL IS HURTING FOR 2 MONTHS. THE FOOT IS BEEN THE SAME BUT NOT WORSE. THE FOOT AGGRAVATED BY WALKING OR STANDING FOR LONG PERIOD. TRIED TO WEAR ORTHOTICS AND STRETCHING AND HELPS MA LOT.    Review of Systems  Musculoskeletal: Positive for gait problem.  All other systems reviewed and are negative.       Objective:   Physical Exam GENERAL APPEARANCE: Alert, conversant. Appropriately groomed. No acute distress.  VASCULAR: Pedal pulses palpable at 2/4 DP and PT bilateral.  Capillary refill time is immediate to all digits,  Proximal to distal cooling it warm to warm.  Digital hair growth is present bilateral  NEUROLOGIC: sensation is intact epicritically and protectively to 5.07 monofilament at 5/5 sites bilateral.  Light touch is intact bilateral, vibratory sensation intact bilateral, achilles tendon reflex is intact bilateral.  MUSCULOSKELETAL: Pes planus deformity is noted bilaterally. Generalized pain along the mid arch region of the right foot is noted. Pain on palpation plantar medial aspect of the right heel at the insertion of the plantar fascia on the medial calcaneal tubercle is present.  DERMATOLOGIC: skin color, texture, and turger are within normal limits.  No preulcerative lesions are seen, no interdigital maceration noted.  No open lesions present.  Digital nails are asymptomatic.   X-rays show a very minor inferior calcaneal spur with decrease in arch height noted. Pes  planovalgus deformity is present.    Assessment & Plan:  Plantar fasciitis, pes planus deformity bilateral  Plan: Recommended an injection for the plantar fasciitis and this was carried out today under sterile technique utilizing Kenalog and Marcaine mixture. He was scanned for orthotics and 3-D orthotics will be made for him through [facility] he will be seen back in 2 weeks where I will dispensed the orthotics for him. Instructions for stretching were carried out as well. If any problems or concerns arise in the meantime he will call otherwise he'll be seen back for picking up of orthotics in 2 weeks

## 2013-06-03 NOTE — Patient Instructions (Signed)

## 2013-06-06 ENCOUNTER — Encounter: Payer: Self-pay | Admitting: Internal Medicine

## 2013-06-17 ENCOUNTER — Encounter: Payer: Self-pay | Admitting: Podiatrist

## 2013-06-17 ENCOUNTER — Ambulatory Visit (INDEPENDENT_AMBULATORY_CARE_PROVIDER_SITE_OTHER): Payer: BC Managed Care – PPO | Admitting: Podiatrist

## 2013-06-17 VITALS — BP 113/70 | HR 61 | Resp 16

## 2013-06-17 DIAGNOSIS — M722 Plantar fascial fibromatosis: Secondary | ICD-10-CM

## 2013-06-17 DIAGNOSIS — Q6650 Congenital pes planus, unspecified foot: Secondary | ICD-10-CM

## 2013-06-17 NOTE — Patient Instructions (Signed)

## 2013-06-17 NOTE — Progress Notes (Signed)
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 He states that they're comfortable and they are providing support his arch region. He denies any complaints at the inserts.   Assessment- pes planus and plantar fasciitis Plan: The orthotics were dispensed. He'll contact me if there is any problems with the devices are thickened adjustment. He'll be seen back as needed for followup.

## 2013-07-08 ENCOUNTER — Ambulatory Visit (INDEPENDENT_AMBULATORY_CARE_PROVIDER_SITE_OTHER): Payer: BC Managed Care – PPO | Admitting: Family Medicine

## 2013-07-08 ENCOUNTER — Encounter: Payer: Self-pay | Admitting: Family Medicine

## 2013-07-08 VITALS — BP 132/76 | HR 72 | Temp 98.0°F | Ht 73.0 in | Wt 242.0 lb

## 2013-07-08 DIAGNOSIS — J019 Acute sinusitis, unspecified: Secondary | ICD-10-CM

## 2013-07-08 MED ORDER — CEFUROXIME AXETIL 500 MG PO TABS: 500.0000 mg | ORAL_TABLET | Freq: Two times a day (BID) | ORAL | Status: DC

## 2013-07-08 MED ORDER — METHYLPREDNISOLONE ACETATE 80 MG/ML IJ SUSP
120.0000 mg | Freq: Once | INTRAMUSCULAR | Status: AC
  Administered 2013-07-08: 120 mg via INTRAMUSCULAR

## 2013-07-08 MED ORDER — FLUTICASONE PROPIONATE 50 MCG/ACT NA SUSP
2.0000 | Freq: Every day | NASAL | Status: DC
Start: 2013-07-08 — End: 2013-08-29

## 2013-07-08 NOTE — Progress Notes (Signed)
Pre visit review using our clinic review tool, if applicable. No additional management support is needed unless otherwise documented below in the visit note. 

## 2013-07-08 NOTE — Addendum Note (Signed)
Addended by: Aniceto BossNIMMONS, Noemi Ishmael A on: 07/08/2013 11:31 AM   Modules accepted: Orders

## 2013-07-08 NOTE — Progress Notes (Signed)
   Subjective:    Patient ID: Shawn Hurley, male    DOB: 05/30/1965, 48 y.o.   MRN: [id number]  HPI Here for seasonal allergies and a sinus infection. He has a dry cough with PND and sinus pressure.   Review of Systems  HENT: Positive for congestion, postnasal drip and sinus pressure.   Eyes: Negative.   Respiratory: Positive for cough.        Objective:   Physical Exam  Constitutional: He appears well-developed and well-nourished.  HENT:  Right Ear: External ear normal.  Left Ear: External ear normal.  Nose: Nose normal.  Mouth/Throat: Oropharynx is clear and moist.  Eyes: Conjunctivae are normal.  Pulmonary/Chest: Effort normal and breath sounds normal.          Assessment & Plan:  Add Mucinex.

## 2013-07-18 ENCOUNTER — Ambulatory Visit: Payer: BC Managed Care – PPO | Admitting: Internal Medicine

## 2013-07-20 ENCOUNTER — Ambulatory Visit (INDEPENDENT_AMBULATORY_CARE_PROVIDER_SITE_OTHER): Payer: BC Managed Care – PPO | Admitting: Internal Medicine

## 2013-07-20 ENCOUNTER — Encounter: Payer: Self-pay | Admitting: *Deleted

## 2013-07-20 ENCOUNTER — Encounter: Payer: Self-pay | Admitting: Internal Medicine

## 2013-07-20 VITALS — BP 112/78 | HR 103 | Ht 73.0 in | Wt 240.0 lb

## 2013-07-20 DIAGNOSIS — I4891 Unspecified atrial fibrillation: Secondary | ICD-10-CM

## 2013-07-20 DIAGNOSIS — I4892 Unspecified atrial flutter: Secondary | ICD-10-CM

## 2013-07-20 MED ORDER — DILTIAZEM HCL ER COATED BEADS 360 MG PO CP24: 360.0000 mg | ORAL_CAPSULE | Freq: Every day | ORAL | Status: DC

## 2013-07-20 MED ORDER — RIVAROXABAN 20 MG PO TABS: 20.0000 mg | ORAL_TABLET | Freq: Every day | ORAL | Status: DC

## 2013-07-20 NOTE — Patient Instructions (Signed)
You are scheduled for an afib ablation on 08-30-13 with Dr Johney FrameAllred.  See instruction sheet  Stop Aspirin and Flecainide.  Start Xarelto 20mg  1 tablet daily with supper.  Increase Cardizem to 360mg  1 tablet daily.

## 2013-07-20 NOTE — Progress Notes (Signed)
PCP: Nelwyn SalisburyFRY,STEPHEN A, MD Primary Cardiologist:  [name]  Shawn Hurley is a 48 y.o. male who presents today for routine electrophysiology followup.  Since last being seen in our clinic, the patient reports doing reasonably well.  He remains in atrial flutter.  He reports dizziness with increased flecainide.  He feels some fatigue with atrial fluter.  Today, he denies symptoms of palpitations, chest pain, shortness of breath,  lower extremity edema,  presyncope, or syncope.  The patient is otherwise without complaint today.   Past Medical History  Diagnosis Date  . Allergy   . Persistent atrial fibrillation     a. atrial fibrillation, April, 2013, b. 07/2011 Echo: EF 55-60%  . Ejection fraction     EF 55-60%, echo, May, 2013  . High risk medication use     Flecainide  . Atrial flutter     typical appearing   Past Surgical History  Procedure Laterality Date  . Hernia repair  2000    umbilical hernia  . Wrist surgery  1979    left wrist, to drain an infection   . Tonsillectomy and adenoidectomy      Current Outpatient Prescriptions  Medication Sig Dispense Refill  . Ascorbic Acid (VITAMIN C) 1000 MG tablet Take 1,000 mg by mouth daily.      . cefUROXime (CEFTIN) 500 MG tablet Take 1 tablet (500 mg total) by mouth 2 (two) times daily with a meal.  20 tablet  0  . diltiazem (CARDIZEM CD) 360 MG 24 hr capsule Take 1 capsule (360 mg total) by mouth daily.  90 capsule  3  . fish oil-omega-3 fatty acids 1000 MG capsule Take 1 g by mouth daily.      . fluticasone (FLONASE) 50 MCG/ACT nasal spray Place 2 sprays into both nostrils daily.  16 g  11  . glucosamine-chondroitin 500-400 MG tablet Take 2 tablets by mouth daily.      Marland Kitchen. ibuprofen (ADVIL,MOTRIN) 200 MG tablet Take 200 mg by mouth as needed.      . loratadine (CLARITIN) 10 MG tablet Take 10 mg by mouth daily.      . Multiple Vitamin (MULTIVITAMIN) capsule Take 1 capsule by mouth daily.      . vitamin E 400 UNIT capsule Take 400 Units by  mouth daily.      . rivaroxaban (XARELTO) 20 MG TABS tablet Take 1 tablet (20 mg total) by mouth daily with supper.  30 tablet  11   No current facility-administered medications for this visit.   ROS- all systems reviewed and negative except as per HPI  Physical Exam: Filed Vitals:   07/20/13 1142  BP: 112/78  Pulse: 103  Height: 6\' 1"  (1.854 m)  Weight: 240 lb (108.863 kg)    GEN- The patient is well appearing, alert and oriented x 3 today.   Head- normocephalic, atraumatic Eyes-  Sclera clear, conjunctiva pink Ears- hearing intact Oropharynx- clear Lungs- Clear to ausculation bilaterally, normal work of breathing Heart- irregular rate and rhythm with frequent ectopy, no murmurs, rubs or gallops, PMI not laterally displaced GI- soft, NT, ND, + BS Extremities- no clubbing, cyanosis, or edema  ekg today reveals atrial flutter with variable conduction  Assessment and Plan:  1. Afib/ atrial flutter He has failed medical therapy with metoprolol and flecainide. Therapeutic strategies for afib/ atria flutter including medicine and ablation were discussed in detail with the patient today. Risk, benefits, and alternatives to EP study and radiofrequency ablation were also discussed in  detail today. These risks include but are not limited to stroke, bleeding, vascular damage, tamponade, perforation, damage to the esophagus, lungs, and other structures, pulmonary vein stenosis, worsening renal function, and death. The patient understands these risk and wishes to proceed.  I will stop ASA and start xarelto today.  We will therefore proceed with catheter ablation once the patient has been adequately anticoagulated.  As flecainide has been ineffective, I will stop this medicine today.  Increase diltiazem to 360mg  daily for better rate control.

## 2013-07-21 ENCOUNTER — Other Ambulatory Visit: Payer: Self-pay | Admitting: *Deleted

## 2013-07-21 DIAGNOSIS — I4891 Unspecified atrial fibrillation: Secondary | ICD-10-CM

## 2013-08-23 ENCOUNTER — Ambulatory Visit (INDEPENDENT_AMBULATORY_CARE_PROVIDER_SITE_OTHER): Payer: BC Managed Care – PPO | Admitting: *Deleted

## 2013-08-23 DIAGNOSIS — I4892 Unspecified atrial flutter: Secondary | ICD-10-CM

## 2013-08-23 DIAGNOSIS — I4891 Unspecified atrial fibrillation: Secondary | ICD-10-CM

## 2013-08-23 LAB — BASIC METABOLIC PANEL
BUN: 17 mg/dL (ref 6–23)
CHLORIDE: 106 meq/L (ref 96–112)
CO2: 27 mEq/L (ref 19–32)
Calcium: 9.6 mg/dL (ref 8.4–10.5)
Creatinine, Ser: 0.9 mg/dL (ref 0.4–1.5)
GFR: 97.9 mL/min (ref >60.00)
Glucose, Bld: 97 mg/dL (ref 70–99)
Potassium: 4 mEq/L (ref 3.5–5.1)
Sodium: 138 mEq/L (ref 135–145)

## 2013-08-23 LAB — PROTIME-INR
INR: 1.4 ratio — ABNORMAL HIGH (ref 0.8–1.0)
Prothrombin Time: 15.8 s — ABNORMAL HIGH (ref 9.6–13.1)

## 2013-08-23 LAB — CBC WITH DIFFERENTIAL/PLATELET
Basophils Absolute: 0 10*3/uL (ref 0.0–0.1)
Basophils Relative: 0.5 % (ref 0.0–3.0)
EOS PCT: 2.4 % (ref 0.0–5.0)
Eosinophils Absolute: 0.1 10*3/uL (ref 0.0–0.7)
HCT: 40.8 % (ref 39.0–52.0)
Hemoglobin: 13.9 g/dL (ref 13.0–17.0)
LYMPHS ABS: 1.3 10*3/uL (ref 0.7–4.0)
Lymphocytes Relative: 28.5 % (ref 12.0–46.0)
MCHC: 34.1 g/dL (ref 30.0–36.0)
MCV: 91.9 fl (ref 78.0–100.0)
MONO ABS: 0.3 10*3/uL (ref 0.1–1.0)
Monocytes Relative: 7.2 % (ref 3.0–12.0)
Neutro Abs: 2.8 10*3/uL (ref 1.4–7.7)
Neutrophils Relative %: 61.4 % (ref 43.0–77.0)
Platelets: 156 10*3/uL (ref 150.0–400.0)
RBC: 4.44 Mil/uL (ref 4.22–5.81)
RDW: 13.2 % (ref 11.5–15.5)
WBC: 4.5 10*3/uL (ref 4.0–10.5)

## 2013-08-29 ENCOUNTER — Ambulatory Visit (HOSPITAL_COMMUNITY)
Admission: RE | Admit: 2013-08-29 | Discharge: 2013-08-29 | Disposition: A | Discharge status: Home / Self Care | Payer: BC Managed Care – PPO | Source: Ambulatory Visit | Attending: Cardiology | Admitting: Cardiology

## 2013-08-29 ENCOUNTER — Encounter (HOSPITAL_COMMUNITY): Payer: Self-pay | Admitting: Pharmacy Technician

## 2013-08-29 ENCOUNTER — Encounter (HOSPITAL_COMMUNITY): Payer: Self-pay

## 2013-08-29 ENCOUNTER — Encounter (HOSPITAL_COMMUNITY)
Admission: RE | Disposition: A | Discharge status: Home / Self Care | Payer: Self-pay | Source: Ambulatory Visit | Attending: Cardiology

## 2013-08-29 DIAGNOSIS — Z79899 Other long term (current) drug therapy: Secondary | ICD-10-CM | POA: Insufficient documentation

## 2013-08-29 DIAGNOSIS — I4892 Unspecified atrial flutter: Secondary | ICD-10-CM | POA: Insufficient documentation

## 2013-08-29 DIAGNOSIS — I4891 Unspecified atrial fibrillation: Secondary | ICD-10-CM

## 2013-08-29 DIAGNOSIS — Z7901 Long term (current) use of anticoagulants: Secondary | ICD-10-CM | POA: Insufficient documentation

## 2013-08-29 HISTORY — PX: TEE WITHOUT CARDIOVERSION: SHX5443

## 2013-08-29 SURGERY — ECHOCARDIOGRAM, TRANSESOPHAGEAL
Anesthesia: Moderate Sedation

## 2013-08-29 MED ORDER — BUTAMBEN-TETRACAINE-BENZOCAINE 2-2-14 % EX AERO
INHALATION_SPRAY | CUTANEOUS | Status: DC | PRN
  Administered 2013-08-29: 2 via TOPICAL

## 2013-08-29 MED ORDER — MIDAZOLAM HCL 10 MG/2ML IJ SOLN
INTRAMUSCULAR | Status: DC | PRN
  Administered 2013-08-29 (×2): 2 mg via INTRAVENOUS
  Administered 2013-08-29: 1 mg via INTRAVENOUS

## 2013-08-29 MED ORDER — MIDAZOLAM HCL 5 MG/ML IJ SOLN
INTRAMUSCULAR | Status: AC
  Filled 2013-08-29: qty 2

## 2013-08-29 MED ORDER — FENTANYL CITRATE 0.05 MG/ML IJ SOLN
INTRAMUSCULAR | Status: DC | PRN
  Administered 2013-08-29 (×2): 25 ug via INTRAVENOUS

## 2013-08-29 MED ORDER — FENTANYL CITRATE 0.05 MG/ML IJ SOLN
INTRAMUSCULAR | Status: AC
  Filled 2013-08-29: qty 2

## 2013-08-29 MED ORDER — SODIUM CHLORIDE 0.9 % IV SOLN
INTRAVENOUS | Status: DC
  Administered 2013-08-29: 09:00:00 via INTRAVENOUS

## 2013-08-29 NOTE — Progress Notes (Signed)
  Echocardiogram Echocardiogram Transesophageal has been performed.  Jorje Guild 08/29/2013, 9:52 AM

## 2013-08-29 NOTE — H&P (Signed)
Shawn Hurley is an 48 y.o. male.   Chief Complaint: atrial flutter HPI: 48 year old male followed by Dr Johney Frame for atrial flutter, failed flecanide trial.  Past Medical History  Diagnosis Date  . Allergy   . Persistent atrial fibrillation     a. atrial fibrillation, April, 2013, b. 07/2011 Echo: EF 55-60%  . Ejection fraction     EF 55-60%, echo, May, 2013  . High risk medication use     Flecainide  . Atrial flutter     typical appearing    Past Surgical History  Procedure Laterality Date  . Hernia repair  2000    umbilical hernia  . Wrist surgery  1979    left wrist, to drain an infection   . Tonsillectomy and adenoidectomy      Family History  Problem Relation Age of Onset  . Stroke Mother    Social History:  reports that he has never smoked. He has never used smokeless tobacco. He reports that he drinks about 3.5 ounces of alcohol per week. He reports that he does not use illicit drugs.  Allergies: No Known Allergies  Medications Prior to Admission  Medication Sig Dispense Refill  . Ascorbic Acid (VITAMIN C) 1000 MG tablet Take 1,000 mg by mouth daily.      Marland Kitchen diltiazem (CARDIZEM CD) 360 MG 24 hr capsule Take 1 capsule (360 mg total) by mouth daily.  90 capsule  3  . fish oil-omega-3 fatty acids 1000 MG capsule Take 1 g by mouth daily.      . fluticasone (FLONASE) 50 MCG/ACT nasal spray Place 2 sprays into both nostrils daily.  16 g  11  . glucosamine-chondroitin 500-400 MG tablet Take 2 tablets by mouth daily.      Marland Kitchen loratadine (CLARITIN) 10 MG tablet Take 10 mg by mouth daily.      . Multiple Vitamin (MULTIVITAMIN) capsule Take 1 capsule by mouth daily.      . rivaroxaban (XARELTO) 20 MG TABS tablet Take 1 tablet (20 mg total) by mouth daily with supper.  30 tablet  11  . vitamin E 400 UNIT capsule Take 400 Units by mouth daily.      . cefUROXime (CEFTIN) 500 MG tablet Take 1 tablet (500 mg total) by mouth 2 (two) times daily with a meal.  20 tablet  0  . flecainide  (TAMBOCOR) 150 MG tablet Take 150 mg by mouth 2 (two) times daily.      Marland Kitchen ibuprofen (ADVIL,MOTRIN) 200 MG tablet Take 200 mg by mouth as needed.        No results found for this or any previous visit (from the past 48 hour(s)). No results found.  ROS  Blood pressure 134/83, temperature 98.1 F (36.7 C), temperature source Oral, resp. rate 16, SpO2 95.00%. Physical Exam   Assessment/Plan The patient is admitted for a TEE prior to the atrial flutter ablation.   Lars Masson 08/29/2013, 8:54 AM

## 2013-08-29 NOTE — CV Procedure (Signed)
     Transesophageal Echocardiogram Note  Shawn Hurley [id number] 07-01-1965  Procedure: Transesophageal Echocardiogram Indications: Atrial flutter, pre-RFA  Procedure Details Consent: Obtained Time Out: Verified patient identification, verified procedure, site/side was marked, verified correct patient position, special equipment/implants available, Radiology Safety Procedures followed,  medications/allergies/relevent history reviewed, required imaging and test results available.  Performed  Medications: Fentanyl: 50 mcg Versed:  5 mg  Left Ventrical:  Normal size, thickness and function, LVEF 55-60%  Mitral Valve: Trace MR  Aortic Valve: No AI  Tricuspid Valve: Mild   Pulmonic Valve: No PR  Left Atrium/ Left atrial appendage: No thrombus in LA or LAA, normal emptying and filling velocities  Atrial septum: no PFO or ASD by bubble study with and without maneuvers  Aorta: Minimal AS plague   Complications: No apparent complications Patient did tolerate procedure well.  Lars Masson, MD, [facility] 08/29/2013, 8:51 AM

## 2013-08-30 ENCOUNTER — Ambulatory Visit (HOSPITAL_COMMUNITY)
Admission: RE | Admit: 2013-08-30 | Discharge: 2013-08-31 | Disposition: A | Discharge status: Home / Self Care | Payer: BC Managed Care – PPO | Source: Ambulatory Visit | Attending: Internal Medicine | Admitting: Internal Medicine

## 2013-08-30 ENCOUNTER — Encounter (HOSPITAL_COMMUNITY): Payer: Self-pay | Admitting: Anesthesiology

## 2013-08-30 ENCOUNTER — Encounter (HOSPITAL_COMMUNITY): Payer: BC Managed Care – PPO | Admitting: Anesthesiology

## 2013-08-30 ENCOUNTER — Ambulatory Visit (HOSPITAL_COMMUNITY): Payer: BC Managed Care – PPO | Admitting: Anesthesiology

## 2013-08-30 ENCOUNTER — Encounter (HOSPITAL_COMMUNITY)
Admission: RE | Disposition: A | Discharge status: Home / Self Care | Payer: Self-pay | Source: Ambulatory Visit | Attending: Internal Medicine

## 2013-08-30 DIAGNOSIS — I4891 Unspecified atrial fibrillation: Secondary | ICD-10-CM

## 2013-08-30 DIAGNOSIS — Z79899 Other long term (current) drug therapy: Secondary | ICD-10-CM | POA: Insufficient documentation

## 2013-08-30 DIAGNOSIS — I4892 Unspecified atrial flutter: Secondary | ICD-10-CM

## 2013-08-30 DIAGNOSIS — Z7901 Long term (current) use of anticoagulants: Secondary | ICD-10-CM | POA: Insufficient documentation

## 2013-08-30 DIAGNOSIS — J301 Allergic rhinitis due to pollen: Secondary | ICD-10-CM | POA: Insufficient documentation

## 2013-08-30 HISTORY — PX: ATRIAL FIBRILLATION ABLATION: SHX5456

## 2013-08-30 HISTORY — PX: ABLATION: SHX5711

## 2013-08-30 LAB — POCT ACTIVATED CLOTTING TIME
ACTIVATED CLOTTING TIME: 238 s
ACTIVATED CLOTTING TIME: 293 s
ACTIVATED CLOTTING TIME: 171 s
Activated Clotting Time: 249 seconds

## 2013-08-30 LAB — MRSA PCR SCREENING: MRSA BY PCR: NEGATIVE

## 2013-08-30 SURGERY — ATRIAL FIBRILLATION ABLATION
Anesthesia: Monitor Anesthesia Care

## 2013-08-30 MED ORDER — SODIUM CHLORIDE 0.9 % IV SOLN: 250.0000 mL | INTRAVENOUS | Status: DC | PRN

## 2013-08-30 MED ORDER — PROPOFOL INFUSION 10 MG/ML OPTIME
INTRAVENOUS | Status: DC | PRN
  Administered 2013-08-30 (×3): via INTRAVENOUS
  Administered 2013-08-30: 50 ug/kg/min via INTRAVENOUS

## 2013-08-30 MED ORDER — HEPARIN SODIUM (PORCINE) 1000 UNIT/ML IJ SOLN
INTRAMUSCULAR | Status: DC | PRN
  Administered 2013-08-30: 5000 [IU] via INTRAVENOUS
  Administered 2013-08-30: 12000 [IU] via INTRAVENOUS
  Administered 2013-08-30: 5000 [IU] via INTRAVENOUS

## 2013-08-30 MED ORDER — HYDROCODONE-ACETAMINOPHEN 5-325 MG PO TABS: 1.0000 | ORAL_TABLET | ORAL | Status: DC | PRN

## 2013-08-30 MED ORDER — DILTIAZEM HCL ER COATED BEADS 360 MG PO CP24
360.0000 mg | ORAL_CAPSULE | Freq: Every day | ORAL | Status: DC
  Administered 2013-08-30 – 2013-08-31 (×2): 360 mg via ORAL
  Filled 2013-08-30 (×2): qty 1

## 2013-08-30 MED ORDER — LIDOCAINE HCL (CARDIAC) 20 MG/ML IV SOLN
INTRAVENOUS | Status: DC | PRN
  Administered 2013-08-30: 50 mg via INTRAVENOUS

## 2013-08-30 MED ORDER — SODIUM CHLORIDE 0.9 % IJ SOLN: 3.0000 mL | INTRAMUSCULAR | Status: DC | PRN

## 2013-08-30 MED ORDER — PROTAMINE SULFATE 10 MG/ML IV SOLN
INTRAVENOUS | Status: DC | PRN
  Administered 2013-08-30 (×3): 10 mg via INTRAVENOUS

## 2013-08-30 MED ORDER — ONDANSETRON HCL 4 MG/2ML IJ SOLN
INTRAMUSCULAR | Status: DC | PRN
  Administered 2013-08-30: 4 mg via INTRAVENOUS

## 2013-08-30 MED ORDER — SODIUM CHLORIDE 0.9 % IV SOLN
INTRAVENOUS | Status: DC
  Administered 2013-08-30: 07:00:00 via INTRAVENOUS

## 2013-08-30 MED ORDER — MIDAZOLAM HCL 5 MG/5ML IJ SOLN
INTRAMUSCULAR | Status: DC | PRN
  Administered 2013-08-30: 2 mg via INTRAVENOUS

## 2013-08-30 MED ORDER — ACETAMINOPHEN 325 MG PO TABS
650.0000 mg | ORAL_TABLET | Freq: Four times a day (QID) | ORAL | Status: DC | PRN
  Administered 2013-08-30 – 2013-08-31 (×2): 650 mg via ORAL
  Filled 2013-08-30 (×2): qty 2

## 2013-08-30 MED ORDER — SODIUM CHLORIDE 0.9 % IJ SOLN
3.0000 mL | Freq: Two times a day (BID) | INTRAMUSCULAR | Status: DC
  Administered 2013-08-30 – 2013-08-31 (×3): 3 mL via INTRAVENOUS

## 2013-08-30 MED ORDER — FENTANYL CITRATE 0.05 MG/ML IJ SOLN
INTRAMUSCULAR | Status: DC | PRN
  Administered 2013-08-30 (×4): 25 ug via INTRAVENOUS
  Administered 2013-08-30: 50 ug via INTRAVENOUS
  Administered 2013-08-30: 25 ug via INTRAVENOUS

## 2013-08-30 MED ORDER — RIVAROXABAN 20 MG PO TABS
20.0000 mg | ORAL_TABLET | Freq: Every day | ORAL | Status: DC
  Administered 2013-08-30: 20 mg via ORAL
  Filled 2013-08-30 (×2): qty 1

## 2013-08-30 MED ORDER — HEPARIN SODIUM (PORCINE) 1000 UNIT/ML IJ SOLN
INTRAMUSCULAR | Status: AC
  Filled 2013-08-30: qty 1

## 2013-08-30 MED ORDER — BUPIVACAINE HCL (PF) 0.25 % IJ SOLN
INTRAMUSCULAR | Status: AC
  Filled 2013-08-30: qty 30

## 2013-08-30 MED ORDER — SODIUM CHLORIDE 0.9 % IV SOLN
INTRAVENOUS | Status: DC | PRN
  Administered 2013-08-30: 07:00:00 via INTRAVENOUS

## 2013-08-30 MED ORDER — DOBUTAMINE IN D5W 4-5 MG/ML-% IV SOLN
INTRAVENOUS | Status: DC | PRN
  Administered 2013-08-30: 10 ug/kg/min via INTRAVENOUS

## 2013-08-30 MED ORDER — DOBUTAMINE IN D5W 4-5 MG/ML-% IV SOLN
INTRAVENOUS | Status: AC
  Filled 2013-08-30: qty 250

## 2013-08-30 NOTE — Anesthesia Postprocedure Evaluation (Signed)
  Anesthesia Post-op Note  Patient: Shawn Hurley  Procedure(s) Performed: Procedure(s): ATRIAL FIBRILLATION ABLATION (N/A)  Patient Location: Cath Lab  Anesthesia Type:MAC  Level of Consciousness: awake and alert   Airway and Oxygen Therapy: Patient Spontanous Breathing  Post-op Pain: none  Post-op Assessment: Post-op Vital signs reviewed  Post-op Vital Signs: stable  Last Vitals:  Filed Vitals:   08/30/13 0622  BP: 132/77  Pulse: 67  Temp: 36.5 C  Resp: 20    Complications: No apparent anesthesia complications

## 2013-08-30 NOTE — Transfer of Care (Signed)
Immediate Anesthesia Transfer of Care Note  Patient: Shawn Hurley  Procedure(s) Performed: Procedure(s): ATRIAL FIBRILLATION ABLATION (N/A)  Patient Location: PACU  Anesthesia Type:MAC  Level of Consciousness: awake, alert  and oriented  Airway & Oxygen Therapy: Patient Spontanous Breathing  Post-op Assessment: Report given to PACU RN and Post -op Vital signs reviewed and stable  Post vital signs: Reviewed and stable  Complications: No apparent anesthesia complications

## 2013-08-30 NOTE — Anesthesia Preprocedure Evaluation (Addendum)
Anesthesia Evaluation  Patient identified by MRN, date of birth, ID band Patient awake    Reviewed: Allergy & Precautions, H&P , NPO status , Patient's Chart, lab work & pertinent test results  History of Anesthesia Complications Negative for: history of anesthetic complications  Airway Mallampati: II TM Distance: >3 FB Neck ROM: Full    Dental  (+) Teeth Intact, Dental Advisory Given   Pulmonary neg pulmonary ROS,  breath sounds clear to auscultation        Cardiovascular + dysrhythmias Atrial Fibrillation Rhythm:Irregular Rate:Normal     Neuro/Psych negative neurological ROS  negative psych ROS   GI/Hepatic negative GI ROS, Neg liver ROS,   Endo/Other  negative endocrine ROS  Renal/GU negative Renal ROS     Musculoskeletal   Abdominal (+) + obese,   Peds  Hematology negative hematology ROS (+)   Anesthesia Other Findings   Reproductive/Obstetrics negative OB ROS                          Anesthesia Physical Anesthesia Plan  ASA: III  Anesthesia Plan: MAC   Post-op Pain Management:    Induction: Intravenous  Airway Management Planned: Natural Airway and Simple Face Mask  Additional Equipment:   Intra-op Plan:   Post-operative Plan:   Informed Consent: I have reviewed the patients History and Physical, chart, labs and discussed the procedure including the risks, benefits and alternatives for the proposed anesthesia with the patient or authorized representative who has indicated his/her understanding and acceptance.     Plan Discussed with: CRNA and Surgeon  Anesthesia Plan Comments:         Anesthesia Quick Evaluation

## 2013-08-30 NOTE — H&P (Signed)
PCP: Nelwyn SalisburyFRY,STEPHEN A, MD  Primary Cardiologist: [name]   Shawn Hurley is a 48 y.o. male who presents today for ablation. Since last being seen in our clinic, the patient reports doing reasonably well. He remains in atrial flutter. He reports dizziness with increased flecainide. He feels some fatigue with atrial flutter. Today, he denies symptoms of palpitations, chest pain, shortness of breath, lower extremity edema, presyncope, or syncope. The patient is otherwise without complaint today.   Past Medical History   Diagnosis  Date   .  Allergy    .  Persistent atrial fibrillation      a. atrial fibrillation, April, 2013, b. 07/2011 Echo: EF 55-60%   .  Ejection fraction      EF 55-60%, echo, May, 2013   .  High risk medication use      Flecainide   .  Atrial flutter      typical appearing    Past Surgical History   Procedure  Laterality  Date   .  Hernia repair   2000     umbilical hernia   .  Wrist surgery   1979     left wrist, to drain an infection   .  Tonsillectomy and adenoidectomy      Current Outpatient Prescriptions   Medication  Sig  Dispense  Refill   .  Ascorbic Acid (VITAMIN C) 1000 MG tablet  Take 1,000 mg by mouth daily.     .  cefUROXime (CEFTIN) 500 MG tablet  Take 1 tablet (500 mg total) by mouth 2 (two) times daily with a meal.  20 tablet  0   .  diltiazem (CARDIZEM CD) 360 MG 24 hr capsule  Take 1 capsule (360 mg total) by mouth daily.  90 capsule  3   .  fish oil-omega-3 fatty acids 1000 MG capsule  Take 1 g by mouth daily.     .  fluticasone (FLONASE) 50 MCG/ACT nasal spray  Place 2 sprays into both nostrils daily.  16 g  11   .  glucosamine-chondroitin 500-400 MG tablet  Take 2 tablets by mouth daily.     Marland Kitchen.  ibuprofen (ADVIL,MOTRIN) 200 MG tablet  Take 200 mg by mouth as needed.     .  loratadine (CLARITIN) 10 MG tablet  Take 10 mg by mouth daily.     .  Multiple Vitamin (MULTIVITAMIN) capsule  Take 1 capsule by mouth daily.     .  vitamin E 400 UNIT capsule   Take 400 Units by mouth daily.     .  rivaroxaban (XARELTO) 20 MG TABS tablet  Take 1 tablet (20 mg total) by mouth daily with supper.  30 tablet  11    No current facility-administered medications for this visit.   ROS- all systems reviewed and negative except as per HPI   Physical Exam:  Filed Vitals:   08/30/13 0622  BP: 132/77  Pulse: 67  Temp: 97.7 F (36.5 C)  Resp: 20   GEN- The patient is well appearing, alert and oriented x 3 today.  Head- normocephalic, atraumatic  Eyes- Sclera clear, conjunctiva pink  Ears- hearing intact  Oropharynx- clear  Lungs- Clear to ausculation bilaterally, normal work of breathing  Heart- irregular rate and rhythm with frequent ectopy, no murmurs, rubs or gallops, PMI not laterally displaced  GI- soft, NT, ND, + BS  Extremities- no clubbing, cyanosis, or edema    Assessment and Plan:  1. Afib/ atrial flutter  He has failed medical therapy with metoprolol and flecainide.  Therapeutic strategies for afib/ atria flutter including medicine and ablation were discussed in detail with the patient today. Risk, benefits, and alternatives to EP study and radiofrequency ablation were also discussed in detail today. These risks include but are not limited to stroke, bleeding, vascular damage, tamponade, perforation, damage to the esophagus, lungs, and other structures, pulmonary vein stenosis, worsening renal function, and death. The patient understands these risk and wishes to proceed.

## 2013-08-30 NOTE — Op Note (Addendum)
SURGEON:  Hillis Range, MD  PREPROCEDURE DIAGNOSES: 1. Atrial fibrillation. 2. Typical appearing atrial flutter  POSTPROCEDURE DIAGNOSES: 1. Atrial fibrillation. 2. Typical appearing atrial flutter  PROCEDURES: 1. Comprehensive electrophysiologic study. 2. Coronary sinus pacing and recording. 3. Three-dimensional mapping of atrial fibrillation with additional mapping and ablation of a second discrete focus (atrial flutter) 4. Ablation of atrial fibrillation with additional mapping and ablation of a second discrete focus (atrial flutter) 5. Intracardiac echocardiography. 6. Transseptal puncture of an intact septum. 7. Rotational Angiography with processing at an independent workstation 8. Arrhythmia induction with pacing with dobutamine infusion  INTRODUCTION:  Shawn Hurley is a 48 y.o. male with a history of atrial fibrillation and typical appearing atrial flutter who now presents for EP study and radiofrequency ablation.  The patient reports initially being diagnosed with atrial fibrillation after presenting with symptomatic palpitations and fatgiue. The patient reports increasing frequency and duration of atrial arrhythmias since that time.  The patient has failed medical therapy with flecainide and diltiazem.  The patient therefore presents today for catheter ablation of atrial fibrillation and atrial flutter.  DESCRIPTION OF PROCEDURE:  Informed written consent was obtained, and the patient was brought to the electrophysiology lab in a fasting state.  The patient was adequately sedated with intravenous medications as outlined in the anesthesia report.  The patient's left and right groins were prepped and draped in the usual sterile fashion by the EP lab staff.  Using a percutaneous Seldinger technique, two 7-French and one 8-French hemostasis sheaths were placed into the right common femoral vein.    3 Dimensional Rotational Angiography: A 5 french pigtail catheter was introduced through  the right common femoral vein and advanced into the inferior venocava.  3 demential rotational angiography was then performed by power injection of 100cc of nonionic contrast.  Reprocessing at an independent work station was then performed.   This demonstrated a moderate sized left atrium with 4 separate pulmonary veins which were also moderate in size.  There were no anomalous veins or significant abnormalities.  A 3 dimensional rendering of the left atrium was then merged using [employer] onto the [employer] system and registered with intracardiac echo (see below).  The pigtail catheter was then removed.  Catheter Placement:  A 7-French Biosense Webster Decapolar coronary sinus catheter was introduced through the right common femoral vein and advanced into the coronary sinus for recording and pacing from this location.  A quadrapolar catheter was introduced through the right common femoral vein and advanced into the right ventricle for recording and pacing.  This catheter was then pulled back to the His bundle location.    Initial Measurements: The patient presented to the electrophysiology lab in sinus rhythm.  The patients PR interval measured 174 msec with a QRS duration of 104 msec and a QT interval of 408 msec.  The AH interval measured 90 msec and the HV interval measured 35 msec.     Intracardiac Echocardiography: An 8-French Biosense Webster AcuNav intracardiac echocardiography catheter was introduced through the left common femoral vein and advanced into the right atrium. Intracardiac echocardiography was performed of the left atrium, and a three-dimensional anatomical rendering of the left atrium was performed using CARTO sound technology.  The patient was noted to have a moderate sized left atrium.  The interatrial septum was prominent but not aneurysmal. All 4 pulmonary veins were visualized and noted to have separate ostia.  The pulmonary veins were moderate in size.  The left  atrial appendage  was visualized and did not reveal thrombus.   There was no evidence of pulmonary vein stenosis.   Transseptal Puncture: The middle right common femoral vein sheath was exchanged for an 8.5 JamaicaFrench SL2 transseptal sheath and transseptal access was achieved in a standard fashion using a Brockenbrough needle under biplane fluoroscopy with intracardiac echocardiography confirmation of the transseptal puncture.  Once transseptal access had been achieved, heparin was administered intravenously and intra- arterially in order to maintain an ACT of greater than 300 seconds throughout the procedure.   3D Mapping and Ablation: The His bundle catheter was removed and in its place a 3.5 mm Biosense Brink's CompanyWebster SmartTouch Thermocool ablation catheter was advanced into the right atrium.  The transseptal sheath was pulled back into the IVC over a guidewire.  The ablation catheter was advanced across the transseptal hole using the wire as a guide.  The transseptal sheath was then re-advanced over the guidewire into the left atrium.  A duodecapolar Biosense Webster circular mapping catheter was introduced through the transseptal sheath and positioned over the mouth of all 4 pulmonary veins.  Three-dimensional electroanatomical mapping was performed using CARTO technology.  This demonstrated electrical activity within all four pulmonary veins at baseline. The patient underwent successful sequential electrical isolation and anatomical encircling of all four pulmonary veins using radiofrequency current with a circular mapping catheter as a guide.   The ablation catheter was then pulled back into the right atrial and positioned along the cavo-tricuspid isthmus.  Mapping along the atrial side of the isthmus was performed.  This demonstrated a standard isthmus.  A series of radiofrequency applications were then delivered along the isthmus.  Complete bidirectional cavotricuspid isthmus block was achieved as confirmed by  differential atrial pacing from the low lateral right atrium.  A stimulus to earliest atrial activation across the isthmus measured 150 msec bi-directionally.  The patient was observe without return of conduction through the isthmus.  Measurements Following Ablation: Following ablation, dobutamine was infused up to 20 mcg/min with no inducible atrial fibrillation, atrial tachycardia, atrial flutter, or sustained PACs. In sinus rhythm with RR interval was 825 msec, with PR 175 msec, QRS 103 msec, and Qt 346 msec.  Following ablation the AH interval measured 84 msec with an HV interval of 34 msec. Ventricular pacing was performed, which revealed midline concentric VA conduction with a VA WCL of 510 msec.  Rapid atrial pacing was performed, which revealed an AV Wenckebach cycle length of 450 msec.  Electroisolation was then again confirmed in all four pulmonary veins.  Intracardiac echocardiography was again performed, which revealed no pericardial effusion.  The procedure was therefore considered completed.  All catheters were removed, and the sheaths were aspirated and flushed.  The patient was transferred to the recovery area for sheath removal per protocol.  A limited bedside transthoracic echocardiogram revealed no pericardial effusion.  There were no early apparent complications.  CONCLUSIONS: 1. Sinus rhythm upon presentation.   2. Rotational Angiography reveals a moderate sized left atrium with four separate pulmonary veins without evidence of pulmonary vein stenosis. 3. Successful electrical isolation and anatomical encircling of all four pulmonary veins with radiofrequency current.    4. Cavo-tricuspid isthmus ablation was performed with complete bidirectional isthmus block achieved.  5. No inducible arrhythmias following ablation both on and off of dobutamine 6. No early apparent complications.   Jeannene Tschetter,MD 11:15 AM 08/30/2013

## 2013-08-30 NOTE — Progress Notes (Signed)
Utilization Review Completed.Tavoris Brisk T Dowell6/11/2013  

## 2013-08-30 NOTE — Discharge Summary (Signed)
ELECTROPHYSIOLOGY PROCEDURE DISCHARGE SUMMARY    Patient ID: Osiris Maroun,  MRN: [id number], DOB/AGE: [birth year] 49 y.o.  Admit date: 08/30/2013 Discharge date: 08/31/2013  Primary Care Physician: Nelwyn Salisbury, MD Primary Cardiologist: Myrtis Ser Electrophysiologist: Hillis Range, MD  Primary Discharge Diagnosis:  Atrial fibrillation and atrial flutter status post ablation this admission  Secondary Discharge Diagnosis:  1. Seasonal allergies   Procedures This Admission:  1.  Electrophysiology study and radiofrequency catheter ablation on 08-30-2013 by Dr Hillis Range.  This study demonstrated sinus rhythm upon presentation; rotational Angiography reveals a moderate sized left atrium with four separate pulmonary veins without evidence of pulmonary vein stenosis; successful electrical isolation and anatomical encircling of all four pulmonary veins with radiofrequency current; cavo-tricuspid isthmus ablation was performed with complete bidirectional isthmus block achieved; no inducible arrhythmias following ablation both on and off of dobutamine.  There were no early apparent complications.   Brief HPI: Cavalli Pacis is a 48 y.o. male with a history of atrial fibrillation and atrial flutter.  They have failed medical therapy with Flecainide and Diltiazem. Risks, benefits, and alternatives to catheter ablation of atrial fibrillation were reviewed with the patient who wished to proceed.  The patient underwent TEE prior to the procedure which demonstrated normal LV function and no LAA thrombus.    Hospital Course:  The patient was admitted and underwent EPS/RFCA of atrial fibrillation with details as outlined above.  They were monitored on telemetry overnight which demonstrated sinus rhythm with occasional PAC's.  Groin was without complication on the day of discharge.  The patient was examined by Dr Johney Frame and considered to be stable for discharge.  Wound care and restrictions were reviewed with the  patient.  The patient will be seen back by Dr Johney Frame in 12 weeks for post ablation follow up.   Discharge Vitals: Blood pressure 118/75, pulse 67, temperature 98.8 F (37.1 C), temperature source Oral, resp. rate 16, height 6\' 1"  (1.854 m), weight 243 lb 13.3 oz (110.6 kg), SpO2 94.00%.  Physical Exam: Filed Vitals:   08/30/13 2100 08/31/13 0021 08/31/13 0443 08/31/13 0748  BP:    118/75  Pulse:    67  Temp: 99.4 F (37.4 C) 99.2 F (37.3 C) 99.7 F (37.6 C) 98.8 F (37.1 C)  TempSrc: Oral Oral Oral Oral  Resp:    16  Height:      Weight:      SpO2:    94%    GEN- The patient is well appearing, alert and oriented x 3 today.   Head- normocephalic, atraumatic Eyes-  Sclera clear, conjunctiva pink Ears- hearing intact Oropharynx- clear Neck- supple,  Lungs- Clear to ausculation bilaterally, normal work of breathing Heart- Regular rate and rhythm, no murmurs, rubs or gallops, PMI not laterally displaced GI- soft, NT, ND, + BS Extremities- no clubbing, cyanosis, or edema, no hematoma/ bruit MS- no significant deformity or atrophy Skin- no rash or lesion Psych- euthymic mood, full affect Neuro- strength and sensation are intact  Labs:   Lab Results  Component Value Date   WBC 4.5 08/23/2013   HGB 13.9 08/23/2013   HCT 40.8 08/23/2013   MCV 91.9 08/23/2013   PLT 156.0 08/23/2013     Recent Labs Lab 08/31/13 0331  NA 142  K 4.2  CL 102  CO2 28  BUN 11  CREATININE 0.98  CALCIUM 8.8  GLUCOSE 109*      Discharge Medications:    Medication List  diltiazem 360 MG 24 hr capsule  Commonly known as:  CARDIZEM CD  Take 1 capsule (360 mg total) by mouth daily.     fish oil-omega-3 fatty acids 1000 MG capsule  Take 1 g by mouth daily.     fluticasone 50 MCG/ACT nasal spray  Commonly known as:  FLONASE  Place 2 sprays into both nostrils daily as needed for allergies.     glucosamine-chondroitin 500-400 MG tablet  Take 2 tablets by mouth daily.      multivitamin capsule  Take 1 capsule by mouth daily.     pantoprazole 40 MG tablet  Commonly known as:  PROTONIX  Take 1 tablet (40 mg total) by mouth daily.     rivaroxaban 20 MG Tabs tablet  Commonly known as:  XARELTO  Take 1 tablet (20 mg total) by mouth daily with supper.     vitamin C 1000 MG tablet  Take 2,000 mg by mouth daily.     vitamin E 400 UNIT capsule  Take 400 Units by mouth daily.        Disposition:   Follow-up Information   Follow up with Hillis RangeJames Skanda Worlds, MD On 11/30/2013. (12 pm)    Specialty:  Cardiology   Contact information:   [address] [place] [address] [phone]       Duration of Discharge Encounter: Greater than 30 minutes including physician time.  Signed,  Hillis RangeJames Georgean Spainhower MD

## 2013-08-30 NOTE — Anesthesia Procedure Notes (Signed)
Procedure Name: MAC Date/Time: 08/30/2013 7:50 AM Performed by: Lovie Chol Pre-anesthesia Checklist: Patient identified, Emergency Drugs available, Suction available, Patient being monitored and Timeout performed Patient Re-evaluated:Patient Re-evaluated prior to inductionOxygen Delivery Method: Simple face mask

## 2013-08-31 ENCOUNTER — Encounter (HOSPITAL_COMMUNITY): Payer: Self-pay | Admitting: *Deleted

## 2013-08-31 DIAGNOSIS — I4892 Unspecified atrial flutter: Secondary | ICD-10-CM

## 2013-08-31 DIAGNOSIS — I4891 Unspecified atrial fibrillation: Secondary | ICD-10-CM

## 2013-08-31 LAB — BASIC METABOLIC PANEL
BUN: 11 mg/dL (ref 6–23)
CALCIUM: 8.8 mg/dL (ref 8.4–10.5)
CO2: 28 mEq/L (ref 19–32)
CREATININE: 0.98 mg/dL (ref 0.50–1.35)
Chloride: 102 mEq/L (ref 96–112)
GFR calc non Af Amer: >90 mL/min (ref >90)
Glucose, Bld: 109 mg/dL — ABNORMAL HIGH (ref 70–99)
Potassium: 4.2 mEq/L (ref 3.7–5.3)
Sodium: 142 mEq/L (ref 137–147)

## 2013-08-31 MED ORDER — PANTOPRAZOLE SODIUM 40 MG PO TBEC: 40.0000 mg | DELAYED_RELEASE_TABLET | Freq: Every day | ORAL | Status: DC

## 2013-08-31 NOTE — Progress Notes (Signed)
Reviewed AVS with patient and wife using teachback method. No questions or complaints at this time. Pt discharged via wheelchair to private vehicle. Belongings sent home with patient.  Dawson Bills, RN

## 2013-08-31 NOTE — Discharge Instructions (Signed)
No driving for 5 days. No lifting over 5 lbs for 1 week. No sexual activity for 1 week. You may return to work in 7 days. Keep procedure site clean & dry. If you notice increased pain, swelling, bleeding or pus, call/return!  You may shower, but no soaking baths/hot tubs/pools for 1 week.  ° ° °

## 2013-11-30 ENCOUNTER — Ambulatory Visit (INDEPENDENT_AMBULATORY_CARE_PROVIDER_SITE_OTHER): Payer: BC Managed Care – PPO | Admitting: Internal Medicine

## 2013-11-30 ENCOUNTER — Encounter: Payer: Self-pay | Admitting: Internal Medicine

## 2013-11-30 VITALS — BP 112/64 | HR 71 | Ht 73.0 in | Wt 240.4 lb

## 2013-11-30 DIAGNOSIS — I4891 Unspecified atrial fibrillation: Secondary | ICD-10-CM

## 2013-11-30 DIAGNOSIS — I4892 Unspecified atrial flutter: Secondary | ICD-10-CM

## 2013-11-30 DIAGNOSIS — I483 Typical atrial flutter: Secondary | ICD-10-CM

## 2013-11-30 DIAGNOSIS — I480 Paroxysmal atrial fibrillation: Secondary | ICD-10-CM

## 2013-11-30 NOTE — Patient Instructions (Addendum)
Your physician recommends that you schedule a follow-up appointment in: 3 months with Dr Johney Frame    Your physician has recommended you make the following change in your medication:  1) Stop Xarelto

## 2013-11-30 NOTE — Progress Notes (Signed)
PCP: Nelwyn Salisbury, MD Primary Cardiologist:   Shawn Hurley is a 48 y.o. male who presents today for routine electrophysiology followup.  Since his recent afib ablation, the patient reports doing very well.  He denies procedure related complications.  He has had no afib.  His energy and exercise tolerance are improved.  Today, he denies symptoms of palpitations, chest pain, shortness of breath,  lower extremity edema, dizziness, presyncope, or syncope.  The patient is otherwise without complaint today.   Past Medical History  Diagnosis Date  . Allergy   . Persistent atrial fibrillation     a. atrial fibrillation, April, 2013, b. 07/2011 Echo: EF 55-60%  . Ejection fraction     EF 55-60%, echo, May, 2013  . High risk medication use     Flecainide  . Atrial flutter     typical appearing   Past Surgical History  Procedure Laterality Date  . Hernia repair  2000    umbilical hernia  . Wrist surgery  1979    left wrist, to drain an infection   . Tonsillectomy and adenoidectomy    . Tee without cardioversion N/A 08/29/2013    Procedure: TRANSESOPHAGEAL ECHOCARDIOGRAM (TEE);  Surgeon: Lars Masson, MD;  Location: [facility] ENDOSCOPY;  Service: Cardiovascular;  Laterality: N/A;  . Ablation  08-30-13    PVI and CTI ablation by Dr Johney Frame    ROS- all systems are reviewed and negatives except as per HPI above  Current Outpatient Prescriptions  Medication Sig Dispense Refill  . Ascorbic Acid (VITAMIN C) 1000 MG tablet Take 2,000 mg by mouth daily.       Marland Kitchen diltiazem (CARDIZEM CD) 360 MG 24 hr capsule Take 1 capsule (360 mg total) by mouth daily.  90 capsule  3  . fish oil-omega-3 fatty acids 1000 MG capsule Take 1 g by mouth daily.      . fluticasone (FLONASE) 50 MCG/ACT nasal spray Place 2 sprays into both nostrils daily as needed for allergies.      Marland Kitchen glucosamine-chondroitin 500-400 MG tablet Take 2 tablets by mouth daily.      . Multiple Vitamin (MULTIVITAMIN) capsule Take 1 capsule by  mouth daily.      . rivaroxaban (XARELTO) 20 MG TABS tablet Take 1 tablet (20 mg total) by mouth daily with supper.  30 tablet  11  . vitamin E 400 UNIT capsule Take 400 Units by mouth daily.       No current facility-administered medications for this visit.    Physical Exam: Filed Vitals:   11/30/13 1212  BP: 112/64  Pulse: 71  Height:  (1.854 m)  Weight: 240 lb 6.4 oz (109.045 kg)    GEN- The patient is well appearing, alert and oriented x 3 today.   Head- normocephalic, atraumatic Eyes-  Sclera clear, conjunctiva pink Ears- hearing intact Oropharynx- clear Lungs- Clear to ausculation bilaterally, normal work of breathing Heart- Regular rate and rhythm, no murmurs, rubs or gallops, PMI not laterally displaced GI- soft, NT, ND, + BS Extremities- no clubbing, cyanosis, or edema  ekg today reveals sinus rhythm  Assessment and Plan:  1. Afib/ atrial flutter Maintaining sinus rhythm off of AAD post ablation chads2vasc score is 0.   Stop xarelto today Consider decreasing diltiazem upon return  Follow-up with me in 3 months

## 2013-12-19 ENCOUNTER — Encounter: Payer: Self-pay | Admitting: Family Medicine

## 2013-12-19 ENCOUNTER — Ambulatory Visit (INDEPENDENT_AMBULATORY_CARE_PROVIDER_SITE_OTHER): Payer: BC Managed Care – PPO | Admitting: Family Medicine

## 2013-12-19 VITALS — BP 120/70 | Temp 98.5°F | Ht 73.0 in | Wt 240.0 lb

## 2013-12-19 DIAGNOSIS — H00019 Hordeolum externum unspecified eye, unspecified eyelid: Secondary | ICD-10-CM

## 2013-12-19 DIAGNOSIS — H00013 Hordeolum externum right eye, unspecified eyelid: Secondary | ICD-10-CM

## 2013-12-19 MED ORDER — SULFACETAMIDE SODIUM 10 % OP SOLN: 2.0000 [drp] | OPHTHALMIC | Status: DC

## 2013-12-19 NOTE — Progress Notes (Signed)
   Subjective:    Patient ID: Shawn Hurley, male    DOB: 06/02/1965, 48 y.o.   MRN: [id number]  HPI Here for 2 days of a tender bump on the right upper eyelid. No other sx.    Review of Systems  Constitutional: Negative.   HENT: Negative.   Eyes: Negative.   Respiratory: Negative.        Objective:   Physical Exam  Constitutional: He appears well-developed and well-nourished.  HENT:  The conjunctivae are clear. The right upper lid has a stye on it          Assessment & Plan:  Use sulfacetamide drops and hot compresses

## 2013-12-19 NOTE — Progress Notes (Signed)
Pre visit review using our clinic review tool, if applicable. No additional management support is needed unless otherwise documented below in the visit note. 

## 2014-01-03 ENCOUNTER — Telehealth: Payer: Self-pay | Admitting: Family Medicine

## 2014-01-03 DIAGNOSIS — H00013 Hordeolum externum right eye, unspecified eyelid: Secondary | ICD-10-CM

## 2014-01-03 NOTE — Telephone Encounter (Signed)
Done

## 2014-02-27 ENCOUNTER — Encounter: Payer: Self-pay | Admitting: Internal Medicine

## 2014-02-27 ENCOUNTER — Ambulatory Visit (INDEPENDENT_AMBULATORY_CARE_PROVIDER_SITE_OTHER): Payer: BC Managed Care – PPO | Admitting: Internal Medicine

## 2014-02-27 VITALS — BP 114/72 | HR 63 | Ht 73.0 in | Wt 245.0 lb

## 2014-02-27 DIAGNOSIS — I480 Paroxysmal atrial fibrillation: Secondary | ICD-10-CM

## 2014-02-27 MED ORDER — DILTIAZEM HCL ER COATED BEADS 180 MG PO CP24: 180.0000 mg | ORAL_CAPSULE | Freq: Every day | ORAL | Status: DC

## 2014-02-27 NOTE — Progress Notes (Signed)
PCP: Nelwyn SalisburyFRY,STEPHEN A, MD Primary Cardiologist:   Dorian PodKatz  Shawn Hurley is a 48 y.o. male who presents today for routine electrophysiology followup.  Since his last visit to our clinic, the patient reports doing very well.  He has had no afib.  His energy and exercise tolerance are improved.    Today, he denies symptoms of palpitations, chest pain, shortness of breath,  lower extremity edema, dizziness, presyncope, or syncope.  The patient is otherwise without complaint today.   Past Medical History  Diagnosis Date  . Allergy   . Persistent atrial fibrillation     a. atrial fibrillation, April, 2013, b. 07/2011 Echo: EF 55-60%  . Ejection fraction     EF 55-60%, echo, May, 2013  . High risk medication use     Flecainide  . Atrial flutter     typical appearing   Past Surgical History  Procedure Laterality Date  . Hernia repair  2000    umbilical hernia  . Wrist surgery  1979    left wrist, to drain an infection   . Tonsillectomy and adenoidectomy    . Tee without cardioversion N/A 08/29/2013    Procedure: TRANSESOPHAGEAL ECHOCARDIOGRAM (TEE);  Surgeon: Lars MassonKatarina H Nelson, MD;  [facility];  Service: Cardiovascular;  Laterality: N/A;  . Ablation  08-30-13    PVI and CTI ablation by Dr Johney FrameAllred    ROS- all systems are reviewed and negatives except as per HPI above  Current Outpatient Prescriptions  Medication Sig Dispense Refill  . Ascorbic Acid (VITAMIN C) 1000 MG tablet Take 2,000 mg by mouth daily.     Marland Kitchen. diltiazem (CARDIZEM CD) 180 MG 24 hr capsule Take 1 capsule (180 mg total) by mouth daily. 90 capsule 3  . fish oil-omega-3 fatty acids 1000 MG capsule Take 1 g by mouth daily.    . fluticasone (FLONASE) 50 MCG/ACT nasal spray Place 2 sprays into both nostrils daily as needed for allergies.    . Ibuprofen (ADVIL) 200 MG CAPS Take 400 capsules by mouth daily as needed (back pain).    . Multiple Vitamin (MULTIVITAMIN) capsule Take 1 capsule by mouth daily.    . vitamin E 400 UNIT  capsule Take 400 Units by mouth daily.    Marland Kitchen. glucosamine-chondroitin 500-400 MG tablet Take 2 tablets by mouth daily.     No current facility-administered medications for this visit.    Physical Exam: Filed Vitals:   02/27/14 1516  BP: 114/72  Pulse: 63  Height: 6\' 1"  (1.854 m)  Weight: 245 lb (111.131 kg)    GEN- The patient is well appearing, alert and oriented x 3 today.   Head- normocephalic, atraumatic Eyes-  Sclera clear, conjunctiva pink Ears- hearing intact Oropharynx- clear Lungs- Clear to ausculation bilaterally, normal work of breathing Heart- Regular rate and rhythm, no murmurs, rubs or gallops, PMI not laterally displaced GI- soft, NT, ND, + BS Extremities- no clubbing, cyanosis, or edema  ekg today reveals sinus rhythm, normal Ekg.  Assessment and Plan:  1. Afib/ atrial flutter Maintaining sinus rhythm off of AAD post ablation chads2vasc score is 0. Decrease diltiazem to 180 mg a day and on return if doing well, consider stopping.     Follow-up with me in 6 months.

## 2014-02-27 NOTE — Patient Instructions (Addendum)
Your physician wants you to follow-up in: 6 months with Dr. Jacquiline DoeAllred You will receive a reminder letter in the mail two months in advance. If you don't receive a letter, please call our office to schedule the follow-up appointment.   Your physician has recommended you make the following change in your medication:  1) Decrease Diltiazem 180mg  daily

## 2014-03-02 ENCOUNTER — Encounter (HOSPITAL_COMMUNITY): Payer: Self-pay | Admitting: Internal Medicine

## 2014-03-22 ENCOUNTER — Telehealth: Payer: Self-pay | Admitting: Family Medicine

## 2014-03-22 MED ORDER — HYDROCODONE-HOMATROPINE 5-1.5 MG/5ML PO SYRP: 5.0000 mL | ORAL_SOLUTION | ORAL | Status: DC | PRN

## 2014-03-22 NOTE — Telephone Encounter (Signed)
rx was given to his wife

## 2014-04-06 ENCOUNTER — Encounter (HOSPITAL_COMMUNITY): Payer: Self-pay | Admitting: Cardiology

## 2014-06-26 ENCOUNTER — Ambulatory Visit (INDEPENDENT_AMBULATORY_CARE_PROVIDER_SITE_OTHER): Payer: Managed Care, Other (non HMO) | Admitting: Family Medicine

## 2014-06-26 ENCOUNTER — Encounter: Payer: Self-pay | Admitting: Family Medicine

## 2014-06-26 VITALS — BP 126/70 | HR 67 | Temp 98.6°F | Ht 73.0 in | Wt 241.0 lb

## 2014-06-26 DIAGNOSIS — Z91048 Other nonmedicinal substance allergy status: Secondary | ICD-10-CM

## 2014-06-26 DIAGNOSIS — Z9109 Other allergy status, other than to drugs and biological substances: Secondary | ICD-10-CM

## 2014-06-26 MED ORDER — METHYLPREDNISOLONE ACETATE 80 MG/ML IJ SUSP
120.0000 mg | Freq: Once | INTRAMUSCULAR | Status: AC
  Administered 2014-06-26: 120 mg via INTRAMUSCULAR

## 2014-06-26 MED ORDER — FLUTICASONE PROPIONATE 50 MCG/ACT NA SUSP: 2.0000 | Freq: Every day | NASAL | Status: DC | PRN

## 2014-06-26 NOTE — Progress Notes (Signed)
   Subjective:    Patient ID: Shawn Hurley, male    DOB: 04/18/1965, 49 y.o.   MRN: [id number]  HPI Here for typical allergy symptoms including itchy eyes, nasal congestion, and sneezing. He is using Flonase and Zyrtec daily.    Review of Systems  Constitutional: Negative.   HENT: Positive for congestion, postnasal drip, rhinorrhea and sneezing.   Eyes: Positive for itching. Negative for pain and redness.  Respiratory: Negative.        Objective:   Physical Exam  Constitutional: He appears well-developed and well-nourished.  HENT:  Right Ear: External ear normal.  Left Ear: External ear normal.  Nose: Nose normal.  Mouth/Throat: Oropharynx is clear and moist.  Eyes: Conjunctivae are normal.  Cardiovascular: Normal rate, regular rhythm, normal heart sounds and intact distal pulses.   Pulmonary/Chest: Effort normal and breath sounds normal.  Lymphadenopathy:    He has no cervical adenopathy.          Assessment & Plan:  Given a steroid shot.

## 2014-06-26 NOTE — Progress Notes (Signed)
Pre visit review using our clinic review tool, if applicable. No additional management support is needed unless otherwise documented below in the visit note. 

## 2014-06-26 NOTE — Addendum Note (Signed)
Addended by: Aniceto BossNIMMONS, SYLVIA A on: 06/26/2014 12:25 PM   Modules accepted: Orders

## 2014-07-24 ENCOUNTER — Encounter: Payer: Managed Care, Other (non HMO) | Admitting: Family Medicine

## 2014-07-31 ENCOUNTER — Other Ambulatory Visit (INDEPENDENT_AMBULATORY_CARE_PROVIDER_SITE_OTHER): Payer: Managed Care, Other (non HMO)

## 2014-07-31 DIAGNOSIS — Z0000 Encounter for general adult medical examination without abnormal findings: Secondary | ICD-10-CM

## 2014-07-31 LAB — LIPID PANEL
Cholesterol: 197 mg/dL (ref 0–200)
HDL: 46.7 mg/dL (ref >39.00)
LDL Cholesterol: 130 mg/dL — ABNORMAL HIGH (ref 0–99)
NonHDL: 150.3
Total CHOL/HDL Ratio: 4
Triglycerides: 100 mg/dL (ref 0.0–149.0)
VLDL: 20 mg/dL (ref 0.0–40.0)

## 2014-07-31 LAB — HEPATIC FUNCTION PANEL
ALBUMIN: 4.3 g/dL (ref 3.5–5.2)
ALT: 25 U/L (ref 0–53)
AST: 15 U/L (ref 0–37)
Alkaline Phosphatase: 73 U/L (ref 39–117)
BILIRUBIN DIRECT: 0.1 mg/dL (ref 0.0–0.3)
Total Bilirubin: 0.5 mg/dL (ref 0.2–1.2)
Total Protein: 7.1 g/dL (ref 6.0–8.3)

## 2014-07-31 LAB — POCT URINALYSIS DIPSTICK
Bilirubin, UA: NEGATIVE
Blood, UA: NEGATIVE
GLUCOSE UA: NEGATIVE
Ketones, UA: NEGATIVE
Leukocytes, UA: NEGATIVE
NITRITE UA: NEGATIVE
Protein, UA: NEGATIVE
Spec Grav, UA: 1.01
UROBILINOGEN UA: 0.2
pH, UA: 6.5

## 2014-07-31 LAB — CBC WITH DIFFERENTIAL/PLATELET
Basophils Absolute: 0 10*3/uL (ref 0.0–0.1)
Basophils Relative: 0.3 % (ref 0.0–3.0)
EOS ABS: 0.2 10*3/uL (ref 0.0–0.7)
Eosinophils Relative: 3.4 % (ref 0.0–5.0)
HCT: 42.6 % (ref 39.0–52.0)
HEMOGLOBIN: 14.7 g/dL (ref 13.0–17.0)
LYMPHS ABS: 1.3 10*3/uL (ref 0.7–4.0)
Lymphocytes Relative: 28.4 % (ref 12.0–46.0)
MCHC: 34.6 g/dL (ref 30.0–36.0)
MCV: 90.6 fl (ref 78.0–100.0)
MONO ABS: 0.4 10*3/uL (ref 0.1–1.0)
Monocytes Relative: 8.3 % (ref 3.0–12.0)
Neutro Abs: 2.8 10*3/uL (ref 1.4–7.7)
Neutrophils Relative %: 59.6 % (ref 43.0–77.0)
PLATELETS: 165 10*3/uL (ref 150.0–400.0)
RBC: 4.7 Mil/uL (ref 4.22–5.81)
RDW: 12.8 % (ref 11.5–15.5)
WBC: 4.6 10*3/uL (ref 4.0–10.5)

## 2014-07-31 LAB — BASIC METABOLIC PANEL
BUN: 17 mg/dL (ref 6–23)
CHLORIDE: 104 meq/L (ref 96–112)
CO2: 29 mEq/L (ref 19–32)
Calcium: 9.7 mg/dL (ref 8.4–10.5)
Creatinine, Ser: 0.8 mg/dL (ref 0.40–1.50)
GFR: 108.86 mL/min (ref >60.00)
Glucose, Bld: 92 mg/dL (ref 70–99)
POTASSIUM: 4.5 meq/L (ref 3.5–5.1)
Sodium: 138 mEq/L (ref 135–145)

## 2014-07-31 LAB — TSH: TSH: 0.66 u[IU]/mL (ref 0.35–4.50)

## 2014-07-31 LAB — PSA: PSA: 1.47 ng/mL (ref 0.10–4.00)

## 2014-08-07 ENCOUNTER — Encounter: Payer: Self-pay | Admitting: Family Medicine

## 2014-08-07 ENCOUNTER — Ambulatory Visit (INDEPENDENT_AMBULATORY_CARE_PROVIDER_SITE_OTHER): Payer: Managed Care, Other (non HMO) | Admitting: Family Medicine

## 2014-08-07 VITALS — BP 114/68 | HR 71 | Temp 98.8°F | Ht 73.0 in | Wt 242.0 lb

## 2014-08-07 DIAGNOSIS — Z0000 Encounter for general adult medical examination without abnormal findings: Secondary | ICD-10-CM

## 2014-08-07 NOTE — Progress Notes (Signed)
   Subjective:    Patient ID: Shawn MarkerGregory Hurley, male    DOB: 01/14/1966, 49 y.o.   MRN: [id number]  HPI 49 yr old male for a cpx. He feels well. He has been in sinus rhythm for some time now. He will see Dr. Johney FrameAllred in a few months, and there is a good chance Tammy SoursGreg can come off Diltiazem at that point. He does mention some occasional heartburn for which he takes baking soda.    Review of Systems  Constitutional: Negative.   HENT: Negative.   Eyes: Negative.   Respiratory: Negative.   Cardiovascular: Negative.   Gastrointestinal: Negative.   Genitourinary: Negative.   Musculoskeletal: Negative.   Skin: Negative.   Neurological: Negative.   Psychiatric/Behavioral: Negative.        Objective:   Physical Exam  Constitutional: He is oriented to person, place, and time. He appears well-developed and well-nourished. No distress.  HENT:  Head: Normocephalic and atraumatic.  Right Ear: External ear normal.  Left Ear: External ear normal.  Nose: Nose normal.  Mouth/Throat: Oropharynx is clear and moist. No oropharyngeal exudate.  Eyes: Conjunctivae and EOM are normal. Pupils are equal, round, and reactive to light. Right eye exhibits no discharge. Left eye exhibits no discharge. No scleral icterus.  Neck: Neck supple. No JVD present. No tracheal deviation present. No thyromegaly present.  Cardiovascular: Normal rate, regular rhythm, normal heart sounds and intact distal pulses.  Exam reveals no gallop and no friction rub.   No murmur heard. Pulmonary/Chest: Effort normal and breath sounds normal. No respiratory distress. He has no wheezes. He has no rales. He exhibits no tenderness.  Abdominal: Soft. Bowel sounds are normal. He exhibits no distension and no mass. There is no tenderness. There is no rebound and no guarding.  Genitourinary: Rectum normal, prostate normal and penis normal. Guaiac negative stool. No penile tenderness.  Musculoskeletal: Normal range of motion. He exhibits no edema or  tenderness.  Lymphadenopathy:    He has no cervical adenopathy.  Neurological: He is alert and oriented to person, place, and time. He has normal reflexes. No cranial nerve deficit. He exhibits normal muscle tone. Coordination normal.  Skin: Skin is warm and dry. No rash noted. He is not diaphoretic. No erythema. No pallor.  Psychiatric: He has a normal mood and affect. His behavior is normal. Judgment and thought content normal.          Assessment & Plan:  Well exam. I suggested he try Zantac OTC prn. He will follow up with Dr. Johney FrameAllred. He needs to exercise some more and lose some weight.

## 2014-08-07 NOTE — Progress Notes (Signed)
Pre visit review using our clinic review tool, if applicable. No additional management support is needed unless otherwise documented below in the visit note. 

## 2014-09-11 ENCOUNTER — Encounter: Payer: Self-pay | Admitting: Internal Medicine

## 2014-09-11 ENCOUNTER — Ambulatory Visit (INDEPENDENT_AMBULATORY_CARE_PROVIDER_SITE_OTHER): Payer: Managed Care, Other (non HMO) | Admitting: Internal Medicine

## 2014-09-11 VITALS — BP 120/72 | HR 83 | Ht 73.0 in | Wt 242.4 lb

## 2014-09-11 DIAGNOSIS — I480 Paroxysmal atrial fibrillation: Secondary | ICD-10-CM | POA: Diagnosis not present

## 2014-09-11 NOTE — Patient Instructions (Addendum)
Medication Instructions:  Your physician recommends that you continue on your current medications as directed. Please refer to the Current Medication list given to you today.   Labwork: None ordered  Testing/Procedures: None ordered  Follow-Up: Your physician recommends that you schedule a follow-up appointment in: 1 week with Rudi Coco, NP   Any Other Special Instructions Will Be Listed Below (If Applicable).

## 2014-09-11 NOTE — Progress Notes (Signed)
Electrophysiology Office Note   Date:  09/11/2014   ID:  Shawn Hurley, DOB [birth year], MRN [id number]  PCP:  Nelwyn Salisbury, MD  Cardiologist:  Dr Myrtis Ser Primary Electrophysiologist: Hillis Range, MD    Chief Complaint  Patient presents with  . PAF     History of Present Illness: Shawn Hurley is a 49 y.o. male who presents today for electrophysiology evaluation.   He feels well today.  Went on a 3 mile hike yesterday.  He is unaware that he is back in afib.  Today, he denies symptoms of palpitations, chest pain, shortness of breath, orthopnea, PND, lower extremity edema, claudication, dizziness, presyncope, syncope, bleeding, or neurologic sequela. The patient is tolerating medications without difficulties and is otherwise without complaint today.    Past Medical History  Diagnosis Date  . Allergy   . Persistent atrial fibrillation     a. atrial fibrillation, April, 2013, b. 07/2011 Echo: EF 55-60%  . Ejection fraction     EF 55-60%, echo, May, 2013  . High risk medication use     Flecainide  . Atrial flutter     typical appearing   Past Surgical History  Procedure Laterality Date  . Hernia repair  2000    umbilical hernia  . Wrist surgery  1979    left wrist, to drain an infection   . Tonsillectomy and adenoidectomy    . Tee without cardioversion N/A 08/29/2013    Procedure: TRANSESOPHAGEAL ECHOCARDIOGRAM (TEE);  Surgeon: Lars Masson, MD;  Location: [facility] ENDOSCOPY;  Service: Cardiovascular;  Laterality: N/A;  . Ablation  08-30-13    PVI and CTI ablation by Dr Johney Frame  . Atrial fibrillation ablation N/A 08/30/2013    Procedure: ATRIAL FIBRILLATION ABLATION;  Surgeon: Gardiner Rhyme, MD;  Location: [facility] CATH LAB;  Service: Cardiovascular;  Laterality: N/A;     Current Outpatient Prescriptions  Medication Sig Dispense Refill  . Ascorbic Acid (VITAMIN C) 1000 MG tablet Take 2,000 mg by mouth daily.     . cetirizine (ZYRTEC) 10 MG tablet Take 10 mg by mouth daily as needed  for allergies or rhinitis.     Marland Kitchen diltiazem (CARDIZEM CD) 180 MG 24 hr capsule Take 1 capsule (180 mg total) by mouth daily. 90 capsule 3  . fish oil-omega-3 fatty acids 1000 MG capsule Take 1 g by mouth daily.    . fluticasone (FLONASE) 50 MCG/ACT nasal spray Place 2 sprays into both nostrils daily as needed for allergies. 16 g 11  . glucosamine-chondroitin 500-400 MG tablet Take 2 tablets by mouth daily.    . Ibuprofen (ADVIL) 200 MG CAPS Take 400 mg by mouth daily as needed (back pain).     . Multiple Vitamin (MULTIVITAMIN) capsule Take 1 capsule by mouth daily.    . vitamin E 400 UNIT capsule Take 400 Units by mouth daily.     No current facility-administered medications for this visit.    Allergies:   Review of patient's allergies indicates no known allergies.   Social History:  The patient  reports that he has never smoked. He has never used smokeless tobacco. He reports that he drinks about 4.2 oz of alcohol per week. He reports that he does not use illicit drugs.   Family History:  The patient's  family history includes Heart attack in his maternal grandfather and maternal grandmother; Stroke in his mother.    ROS:  Please see the history of present illness.   All other systems are reviewed and  negative.    PHYSICAL EXAM: VS:  BP 120/72 mmHg  Pulse 83  Ht  (1.854 m)  Wt 109.952 kg (242 lb 6.4 oz)  BMI 31.99 kg/m2 , BMI Body mass index is 31.99 kg/(m^2). GEN: Well nourished, well developed, in no acute distress HEENT: normal Neck: no JVD, carotid bruits, or masses Cardiac: iRRR; no murmurs, rubs, or gallops,no edema  Respiratory:  clear to auscultation bilaterally, normal work of breathing GI: soft, nontender, nondistended, + BS MS: no deformity or atrophy Skin: warm and dry  Neuro:  Strength and sensation are intact Psych: euthymic mood, full affect  EKG:  EKG is ordered today. The ekg ordered today shows afib, V rates 80s   Recent Labs: 07/31/2014: ALT 25; BUN  17; Creatinine, Ser 0.80; Hemoglobin 14.7; Platelets 165.0; Potassium 4.5; Sodium 138; TSH 0.66    Lipid Panel     Component Value Date/Time   CHOL 197 07/31/2014 0918   TRIG 100.0 07/31/2014 0918   HDL 46.70 07/31/2014 0918   CHOLHDL 4 07/31/2014 0918   VLDL 20.0 07/31/2014 0918   LDLCALC 130* 07/31/2014 0918     Wt Readings from Last 3 Encounters:  09/11/14 109.952 kg (242 lb 6.4 oz)  08/07/14 109.77 kg (242 lb)  06/26/14 109.317 kg (241 lb)     ASSESSMENT AND PLAN:  1.  afib He has returned to afib but is rate controlled No symptoms cahds2vasc score is 0 Will have him return to see Rudi Coco NP in the AF clinic in  1 week.  If still in afib, would restart pradaxa and repeat cardioversion 4 weeks after initiation of anticoagulation with follow-up with me 4 weeks after that.  If he is in sinus when he sees Rudi Coco NP in 1 week, then I can just see him back in 6 months  Today, I have spent  25  minutes with the patient discussing afib management .  More than 50% of the visit time today was spent on this issue.  Current medicines are reviewed at length with the patient today.   The patient does not have concerns regarding his medicines.  The following changes were made today:  none   Signed, Hillis Range, MD  09/11/2014 3:11 PM     [facility] [address] [place] [address] [phone] (office) [phone] (fax)

## 2014-09-18 ENCOUNTER — Other Ambulatory Visit (HOSPITAL_COMMUNITY): Payer: Self-pay | Admitting: *Deleted

## 2014-09-18 ENCOUNTER — Ambulatory Visit (HOSPITAL_COMMUNITY)
Admission: RE | Admit: 2014-09-18 | Discharge: 2014-09-18 | Disposition: A | Discharge status: Home / Self Care | Payer: Managed Care, Other (non HMO) | Source: Ambulatory Visit | Attending: Nurse Practitioner | Admitting: Nurse Practitioner

## 2014-09-18 ENCOUNTER — Encounter (HOSPITAL_COMMUNITY): Payer: Self-pay | Admitting: Nurse Practitioner

## 2014-09-18 VITALS — BP 140/82 | HR 102 | Ht 73.0 in | Wt 240.6 lb

## 2014-09-18 DIAGNOSIS — I4891 Unspecified atrial fibrillation: Secondary | ICD-10-CM | POA: Diagnosis present

## 2014-09-18 DIAGNOSIS — I4819 Other persistent atrial fibrillation: Secondary | ICD-10-CM

## 2014-09-18 MED ORDER — RIVAROXABAN 20 MG PO TABS: 20.0000 mg | ORAL_TABLET | Freq: Every day | ORAL | Status: DC

## 2014-09-18 NOTE — Progress Notes (Signed)
Patient ID: Shawn Hurley, male   DOB: [birth year], 49 y.o.   MRN: [id number]     Primary Care Physician: Nelwyn Salisbury, MD Referring Physician: Dr. Hendricks Milo Shawn Hurley is a 49 y.o. male with a h/o ablation 08/30/13 for evaluation in the afib clinic. H was seen in f/u with Dr. Sanjuan Dame 6/20 and was surprised to be told he was in afib. He can't feel the palpitations but now thinks it does may be causing some fatigue. He was off blood thinner for a chadsvasc score of 0. He was asked to f/u here today and if still in afib, restart OAC and get set up for cardioversion in 4 weeks.   He is still minimally symptomatic with afb. Ekg today shows v rate at 102 bpm. He will start on Xarelto with h/o normal kidney function and will be given 0$ co pay card. He is aware to take once a day with dinner. He did tolerate pradaxa before but does not have any of this drug left and is happy to have 0 copay with pharma card and to take only once a day. He will have vacations back to back and will not be available for cardioversion until first of August. He continues on Cardizem daily.  Today, he denies symptoms of palpitations, chest pain, shortness of breath, orthopnea, PND, lower extremity edema, dizziness, presyncope, syncope, or neurologic sequela. Positive for mild fatigue. The patient is tolerating medications without difficulties and is otherwise without complaint today.   Past Medical History  Diagnosis Date  . Allergy   . Persistent atrial fibrillation     a. atrial fibrillation, April, 2013, b. 07/2011 Echo: EF 55-60%  . Ejection fraction     EF 55-60%, echo, May, 2013  . High risk medication use     Flecainide  . Atrial flutter     typical appearing   Past Surgical History  Procedure Laterality Date  . Hernia repair  2000    umbilical hernia  . Wrist surgery  1979    left wrist, to drain an infection   . Tonsillectomy and adenoidectomy    . Tee without cardioversion N/A 08/29/2013    Procedure:  TRANSESOPHAGEAL ECHOCARDIOGRAM (TEE);  Surgeon: Lars Masson, MD;  Location: [facility] ENDOSCOPY;  Service: Cardiovascular;  Laterality: N/A;  . Ablation  08-30-13    PVI and CTI ablation by Dr Johney Frame  . Atrial fibrillation ablation N/A 08/30/2013    Procedure: ATRIAL FIBRILLATION ABLATION;  Surgeon: Gardiner Rhyme, MD;  Location: [facility] CATH LAB;  Service: Cardiovascular;  Laterality: N/A;    Current Outpatient Prescriptions  Medication Sig Dispense Refill  . Ascorbic Acid (VITAMIN C) 1000 MG tablet Take 2,000 mg by mouth daily.     . cetirizine (ZYRTEC) 10 MG tablet Take 10 mg by mouth daily as needed for allergies or rhinitis.     Marland Kitchen diltiazem (CARDIZEM CD) 180 MG 24 hr capsule Take 1 capsule (180 mg total) by mouth daily. 90 capsule 3  . fish oil-omega-3 fatty acids 1000 MG capsule Take 1 g by mouth daily.    . fluticasone (FLONASE) 50 MCG/ACT nasal spray Place 2 sprays into both nostrils daily as needed for allergies. 16 g 11  . glucosamine-chondroitin 500-400 MG tablet Take 2 tablets by mouth daily.    . Ibuprofen (ADVIL) 200 MG CAPS Take 400 mg by mouth daily as needed (back pain).     . Multiple Vitamin (MULTIVITAMIN) capsule Take 1 capsule by mouth daily.    Marland Kitchen  vitamin E 400 UNIT capsule Take 400 Units by mouth daily.    . rivaroxaban (XARELTO) 20 MG TABS tablet Take 1 tablet (20 mg total) by mouth daily with supper. 30 tablet 1   No current facility-administered medications for this encounter.    No Known Allergies  History   Social History  . Marital Status: Married    Spouse Name: N/A  . Number of Children: N/A  . Years of Education: N/A   Occupational History  . Not on file.   Social History Main Topics  . Smoking status: Never Smoker   . Smokeless tobacco: Never Used  . Alcohol Use: 4.2 oz/week    7 Standard drinks or equivalent per week     Comment: 1-2 drinks per day  . Drug Use: No  . Sexual Activity: Yes   Other Topics Concern  . Not on file   Social History  Narrative   [place] Works as Water engineerhead chef at the [employer]    Family History  Problem Relation Age of Onset  . Stroke Mother   . Heart attack Maternal Grandmother   . Heart attack Maternal Grandfather     ROS- All systems are reviewed and negative except as per the HPI above  Physical Exam: Filed Vitals:   09/18/14 0901  BP: 140/82  Pulse: 102  Height: 6\' 1"  (1.854 m)  Weight: 240 lb 9.6 oz (109.135 kg)    GEN- The patient is well appearing, alert and oriented x 3 today.   Head- normocephalic, atraumatic Eyes-  Sclera clear, conjunctiva pink Ears- hearing intact Oropharynx- clear Neck- supple, no JVP Lymph- no cervical lymphadenopathy Lungs- Clear to ausculation bilaterally, normal work of breathing Heart- Irregular rate and rhythm, no murmurs, rubs or gallops, PMI not laterally displaced GI- soft, NT, ND, + BS Extremities- no clubbing, cyanosis, or edema MS- no significant deformity or atrophy Skin- no rash or lesion Psych- euthymic mood, full affect Neuro- strength and sensation are intact  EKG- Afib at 102 bpm. QRS 94 ms, QTc 461 ms. Epic records reviewed. Labs 07/31/14 138 142R 138       Potassium 3.5 - 5.1 mEq/L 4.5 4.2R 4.0    Chloride 96 - 112 mEq/L 104 102 106    CO2 19 - 32 mEq/L 29 28 27     Glucose, Bld 70 - 99 mg/dL 92 161109 (H) 97    BUN 6 - 23 mg/dL 17 11 17     Creatinine, Ser 0.40 - 1.50 mg/dL 0.960.80 0.45W0.98R 0.9W0.9R    Calcium 8.4 - 10.5 mg/dL 9.7 8.8 9.6    GFR >11.91>60.00 mL/min 108.86             Assessment and Plan: 1. Persistent afib Start back on OAC, Xarelto 20 mg q day at dinner time, and once on drug x 4 weeks will cardiovert. Due to pts vacation will be slighly delayed until first 1-2 weeks in August.  Continue diltiazem 180 mg qd. Chadsvasc score is 0.  Will see back in office week of August  15th to get labs and set up cardioversion.

## 2014-09-18 NOTE — Patient Instructions (Signed)
Your physician has recommended you make the following change in your medication:  1)Xarelto  once a day with supper.  Follow up appointment parking code is [id number]

## 2014-10-02 ENCOUNTER — Telehealth: Payer: Self-pay | Admitting: Internal Medicine

## 2014-10-02 NOTE — Telephone Encounter (Signed)
New Message  Pt called requests a call back to discuss the upcomming visit. Pt requests a call back to discuss. Req to no longer visit the AFIB clinic.   Closing encounter. Called and discussed with Allred's Scheduler

## 2014-10-05 ENCOUNTER — Telehealth: Payer: Self-pay | Admitting: Internal Medicine

## 2014-10-05 NOTE — Telephone Encounter (Signed)
New Message       Pt calling wanting a sooner appt w/ Dr. Johney FrameAllred, pt was given the first available appt with him that is after 11/01/14 when pt gets back from vacation. Pt was also offered another appt with one of the EP Pa's and pt states he only wants to see Dr. Johney FrameAllred and wants to talk to his nurse about it. Please call back and advise.

## 2014-10-09 NOTE — Telephone Encounter (Signed)
New Message        Pt calling wanting to speak to a nurse about his upcoming appt. Please call back and advise.

## 2014-10-09 NOTE — Telephone Encounter (Signed)
LMTCB

## 2014-10-09 NOTE — Telephone Encounter (Signed)
Pt calling to speak with Dr Jenel LucksAllred's nurse in regards to obtaining a sooner appt with Dr Johney FrameAllred before his scheduled appt on 11/15/14.  Pt states he was to see Sharilyn Sitesonna Caroll NP on 8/11 for follow-up of afib and to set up his cardioversion.  Pt states he went over to see Rudi Cocoonna Carroll NP in afib clinic one time, and had a very good experience there, but the pt states he can absolutely not afford the cost to go over there to be followed by the afib clinic.  Pt states the bill cost him almost $350 to be seen as a pt in the afib clinic.  Pt states he cannot afford this cost, and will need to followed by Dr Johney FrameAllred in our office for his afib and cardioversion set-up. Pt requesting Eula ListenKelly RN, Dr Jenel LucksAllred's nurse call him back to see if she can have his follow-up appt with Dr Johney FrameAllred on 8/24 moved to a sooner date, sometime in early August.  Pt states he thinks that waiting all the way until Aug 24 is too long to be in afib, being he was scheduled to see Sharilyn Sitesonna Caroll NP on 8/11, for this reason. Informed the pt that Tresa EndoKelly is out of the office today, but will return tomorrow.  Informed the pt that I will route this message to her for further review and follow-up with the pt thereafter.  Pt verbalized understanding and agrees with this plan.

## 2014-10-12 NOTE — Telephone Encounter (Signed)
50 copay and then 350 for afib clinic.  He says he can not do that again.  He is needing to have a DCCV and will be out of town for weddings coming back on 11/03/14.  Wants to know as he will have been on Xarelto can we just schedule appointment for DCCV

## 2014-10-20 NOTE — Telephone Encounter (Signed)
Appointment 10/25/14.

## 2014-10-25 ENCOUNTER — Encounter: Payer: Self-pay | Admitting: Internal Medicine

## 2014-10-25 ENCOUNTER — Ambulatory Visit (INDEPENDENT_AMBULATORY_CARE_PROVIDER_SITE_OTHER): Payer: Managed Care, Other (non HMO) | Admitting: Internal Medicine

## 2014-10-25 VITALS — BP 124/82 | HR 73 | Ht 73.0 in | Wt 238.6 lb

## 2014-10-25 DIAGNOSIS — I480 Paroxysmal atrial fibrillation: Secondary | ICD-10-CM

## 2014-10-25 MED ORDER — ASPIRIN EC 81 MG PO TBEC: 81.0000 mg | DELAYED_RELEASE_TABLET | Freq: Every day | ORAL | Status: DC

## 2014-10-25 NOTE — Progress Notes (Signed)
PCP: Nelwyn Salisbury, MD Primary Cardiologist:  Dr Dorian Pod is a 49 y.o. male who presents today for routine electrophysiology followup.  Since last being seen in our clinic, the patient reports doing very well.  He has returned to sinus.  No symptoms during afib.  Has been in [place] and will next week be in [place].  Today, he denies symptoms of palpitations, chest pain, shortness of breath,  lower extremity edema, dizziness, presyncope, or syncope.  The patient is otherwise without complaint today.   Past Medical History  Diagnosis Date  . Allergy   . Persistent atrial fibrillation     a. atrial fibrillation, April, 2013, b. 07/2011 Echo: EF 55-60%  . Ejection fraction     EF 55-60%, echo, May, 2013  . High risk medication use     Flecainide  . Atrial flutter     typical appearing   Past Surgical History  Procedure Laterality Date  . Hernia repair  2000    umbilical hernia  . Wrist surgery  1979    left wrist, to drain an infection   . Tonsillectomy and adenoidectomy    . Tee without cardioversion N/A 08/29/2013    Procedure: TRANSESOPHAGEAL ECHOCARDIOGRAM (TEE);  Surgeon: Lars Masson, MD;  Location: [facility] ENDOSCOPY;  Service: Cardiovascular;  Laterality: N/A;  . Ablation  08-30-13    PVI and CTI ablation by Dr Johney Frame  . Atrial fibrillation ablation N/A 08/30/2013    Procedure: ATRIAL FIBRILLATION ABLATION;  Surgeon: Gardiner Rhyme, MD;  Location: [facility] CATH LAB;  Service: Cardiovascular;  Laterality: N/A;    ROS- all systems are reviewed and negatives except as per HPI above  Current Outpatient Prescriptions  Medication Sig Dispense Refill  . Ascorbic Acid (VITAMIN C) 1000 MG tablet Take 2,000 mg by mouth daily.     . cetirizine (ZYRTEC) 10 MG tablet Take 10 mg by mouth daily as needed for allergies or rhinitis.     Marland Kitchen diltiazem (CARDIZEM CD) 180 MG 24 hr capsule Take 1 capsule (180 mg total) by mouth daily. 90 capsule 3  . fish oil-omega-3 fatty acids 1000 MG capsule Take 1 g  by mouth daily.    . fluticasone (FLONASE) 50 MCG/ACT nasal spray Place 2 sprays into both nostrils daily as needed for allergies. 16 g 11  . glucosamine-chondroitin 500-400 MG tablet Take 2 tablets by mouth daily.    . Ibuprofen (ADVIL) 200 MG CAPS Take 400 mg by mouth daily as needed (back pain).     . Multiple Vitamin (MULTIVITAMIN) capsule Take 1 capsule by mouth daily.    . rivaroxaban (XARELTO) 20 MG TABS tablet Take 1 tablet (20 mg total) by mouth daily with supper. 30 tablet 1  . vitamin E 400 UNIT capsule Take 400 Units by mouth daily.     No current facility-administered medications for this visit.    Physical Exam: Filed Vitals:   10/25/14 0826  BP: 124/82  Pulse: 73  Height: 6\' 1"  (1.854 m)  Weight: 108.228 kg (238 lb 9.6 oz)    GEN- The patient is well appearing, alert and oriented x 3 today.   Head- normocephalic, atraumatic Eyes-  Sclera clear, conjunctiva pink Ears- hearing intact Oropharynx- clear Lungs- Clear to ausculation bilaterally, normal work of breathing Heart- Regular rate and rhythm, no murmurs, rubs or gallops, PMI not laterally displaced GI- soft, NT, ND, + BS Extremities- no clubbing, cyanosis, or edema  ekg today reveals sinus rhythm, normal ekg  Assessment  and Plan:  1. Paroxysmal atrial fibrillation No in sinus chads2vasc score is 0. Stop xarelto He wants to take asa  Return in 3 months

## 2014-10-25 NOTE — Patient Instructions (Signed)
Medication Instructions:  Your physician has recommended you make the following change in your medication:  1) Stop Xarelto 2) Start Aspirin  daily   Labwork: None ordered  Testing/Procedures: None ordered  Follow-Up: Your physician recommends that you schedule a follow-up appointment in: 3 months with Dr Johney Frame   Any Other Special Instructions Will Be Listed Below (If Applicable).

## 2014-11-02 ENCOUNTER — Inpatient Hospital Stay (HOSPITAL_COMMUNITY)
Admission: RE | Admit: 2014-11-02 | Payer: Managed Care, Other (non HMO) | Source: Ambulatory Visit | Admitting: Nurse Practitioner

## 2014-11-13 ENCOUNTER — Telehealth: Payer: Self-pay | Admitting: Internal Medicine

## 2014-11-13 NOTE — Telephone Encounter (Signed)
I received a note from scheduling this morning that the patient and wife had questions regarding their billing for the Afib clinic.  I called Mr. Shawn Hurley at [phone] and left a message to return the call.  I also called Mrs. Shawn Hurley at [phone] and left a message to return the call.

## 2014-11-15 ENCOUNTER — Ambulatory Visit: Payer: Managed Care, Other (non HMO) | Admitting: Internal Medicine

## 2015-01-29 ENCOUNTER — Ambulatory Visit (INDEPENDENT_AMBULATORY_CARE_PROVIDER_SITE_OTHER): Payer: Managed Care, Other (non HMO) | Admitting: Internal Medicine

## 2015-01-29 ENCOUNTER — Encounter: Payer: Self-pay | Admitting: Internal Medicine

## 2015-01-29 VITALS — BP 118/72 | HR 65 | Ht 73.0 in | Wt 233.0 lb

## 2015-01-29 DIAGNOSIS — I480 Paroxysmal atrial fibrillation: Secondary | ICD-10-CM

## 2015-01-29 NOTE — Patient Instructions (Signed)
Medication Instructions:  Your physician recommends that you continue on your current medications as directed. Please refer to the Current Medication list given to you today.  Labwork: None ordered   Testing/Procedures: None ordered   Follow-Up: Your physician recommends that you schedule a follow-up appointment in: 6 months with Dr. Allred.   Any Other Special Instructions Will Be Listed Below (If Applicable).     If you need a refill on your cardiac medications before your next appointment, please call your pharmacy.   

## 2015-01-29 NOTE — Progress Notes (Signed)
PCP: Nelwyn SalisburyFRY,STEPHEN A, MD Primary Cardiologist:  [name]  Shawn Hurley is a 49 y.o. male who presents today for routine electrophysiology followup.  Since last being seen in our clinic, the patient reports doing very well.   He is pleased with his current health and is exercising without limitation.  His daughter is applying for colleges presently.   Today, he denies symptoms of palpitations, chest pain, shortness of breath,  lower extremity edema, dizziness, presyncope, or syncope.  The patient is otherwise without complaint today.   Past Medical History  Diagnosis Date  . Allergy   . Persistent atrial fibrillation (HCC)     a. atrial fibrillation, April, 2013, b. 07/2011 Echo: EF 55-60%  . Ejection fraction     EF 55-60%, echo, May, 2013  . High risk medication use     Flecainide  . Atrial flutter (HCC)     typical appearing   Past Surgical History  Procedure Laterality Date  . Hernia repair  2000    umbilical hernia  . Wrist surgery  1979    left wrist, to drain an infection   . Tonsillectomy and adenoidectomy    . Tee without cardioversion N/A 08/29/2013    Procedure: TRANSESOPHAGEAL ECHOCARDIOGRAM (TEE);  Surgeon: Lars MassonKatarina H Nelson, MD;  [facility];  Service: Cardiovascular;  Laterality: N/A;  . Ablation  08-30-13    PVI and CTI ablation by Dr Johney FrameAllred  . Atrial fibrillation ablation N/A 08/30/2013    Procedure: ATRIAL FIBRILLATION ABLATION;  Surgeon: Gardiner RhymeJames D Amalie Koran, MD;  Location: [facility] CATH LAB;  Service: Cardiovascular;  Laterality: N/A;    ROS- all systems are reviewed and negatives except as per HPI above  Current Outpatient Prescriptions  Medication Sig Dispense Refill  . Ascorbic Acid (VITAMIN C) 1000 MG tablet Take 2,000 mg by mouth daily.     Marland Kitchen. aspirin EC 81 MG tablet Take 1 tablet (81 mg total) by mouth daily. 90 tablet 3  . diltiazem (CARDIZEM CD) 180 MG 24 hr capsule Take 1 capsule (180 mg total) by mouth daily. 90 capsule 3  . fish oil-omega-3 fatty acids 1000 MG  capsule Take 1 g by mouth daily.    Marland Kitchen. glucosamine-chondroitin 500-400 MG tablet Take 2 tablets by mouth daily.    . Ibuprofen (ADVIL) 200 MG CAPS Take 400 mg by mouth daily as needed (back pain).     . Multiple Vitamin (MULTIVITAMIN) capsule Take 1 capsule by mouth daily.    . vitamin E 400 UNIT capsule Take 400 Units by mouth daily.    . cetirizine (ZYRTEC) 10 MG tablet Take 10 mg by mouth daily as needed for allergies or rhinitis.     . fluticasone (FLONASE) 50 MCG/ACT nasal spray Place 2 sprays into both nostrils daily as needed for allergies. (Patient not taking: Reported on 01/29/2015) 16 g 11   No current facility-administered medications for this visit.    Physical Exam: Filed Vitals:   01/29/15 1010  BP: 118/72  Pulse: 65  Height: 6\' 1"  (1.854 m)  Weight: 233 lb (105.688 kg)    GEN- The patient is well appearing, alert and oriented x 3 today.   Head- normocephalic, atraumatic Eyes-  Sclera clear, conjunctiva pink Ears- hearing intact Oropharynx- clear Lungs- Clear to ausculation bilaterally, normal work of breathing Heart- Regular rate and rhythm, no murmurs, rubs or gallops, PMI not laterally displaced GI- soft, NT, ND, + BS Extremities- no clubbing, cyanosis, or edema  ekg today reveals sinus rhythm, normal  ekg  Assessment and Plan:  1. Paroxysmal atrial fibrillation Remains in sinus off of AAD therapy chads2vasc score is 0. He wants to take asa  Return in 6 months  Hillis Range MD, [facility] 01/29/2015 10:29 AM

## 2015-03-11 ENCOUNTER — Other Ambulatory Visit: Payer: Self-pay | Admitting: Internal Medicine

## 2015-03-12 ENCOUNTER — Other Ambulatory Visit: Payer: Self-pay | Admitting: *Deleted

## 2015-03-12 DIAGNOSIS — I480 Paroxysmal atrial fibrillation: Secondary | ICD-10-CM

## 2015-03-12 MED ORDER — DILTIAZEM HCL ER COATED BEADS 180 MG PO CP24: 180.0000 mg | ORAL_CAPSULE | Freq: Every day | ORAL | Status: DC

## 2015-06-07 ENCOUNTER — Encounter: Payer: Self-pay | Admitting: Internal Medicine

## 2015-06-22 ENCOUNTER — Ambulatory Visit (INDEPENDENT_AMBULATORY_CARE_PROVIDER_SITE_OTHER): Payer: Managed Care, Other (non HMO) | Admitting: Nurse Practitioner

## 2015-06-22 ENCOUNTER — Encounter: Payer: Self-pay | Admitting: Nurse Practitioner

## 2015-06-22 VITALS — BP 120/70 | HR 80 | Ht 73.0 in | Wt 227.2 lb

## 2015-06-22 DIAGNOSIS — I480 Paroxysmal atrial fibrillation: Secondary | ICD-10-CM

## 2015-06-22 NOTE — Progress Notes (Signed)
Electrophysiology Office Note Date: 06/22/2015  ID:  Shawn Hurley, DOB 05/01/1965, MRN [id number]  PCP: Nelwyn SalisburyFRY,STEPHEN A, MD Primary Cardiologist: Myrtis SerKatz Electrophysiologist: Allred  CC: atrial fibrillation follow up  Shawn Hurley is a 50 y.o. male seen today for Dr Johney FrameAllred.  He presents today for routine electrophysiology followup.  Since last being seen in our clinic, the patient reports doing very well.  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 He has not been aware of any atrial fibrillation.  He is exercising 3-4 days per week without limitation. He denies chest pain, palpitations, dyspnea, PND, orthopnea, nausea, vomiting, dizziness, syncope, edema, weight gain, or early satiety.  Past Medical History  Diagnosis Date  . Allergy   . Persistent atrial fibrillation (HCC)     a. atrial fibrillation, April, 2013, b. 07/2011 Echo: EF 55-60%  . Ejection fraction     EF 55-60%, echo, May, 2013  . High risk medication use     Flecainide  . Atrial flutter (HCC)     typical appearing   Past Surgical History  Procedure Laterality Date  . Hernia repair  2000    umbilical hernia  . Wrist surgery  1979    left wrist, to drain an infection   . Tonsillectomy and adenoidectomy    . Tee without cardioversion N/A 08/29/2013    Procedure: TRANSESOPHAGEAL ECHOCARDIOGRAM (TEE);  Surgeon: Lars MassonKatarina H Nelson, MD;  [facility];  Service: Cardiovascular;  Laterality: N/A;  . Ablation  08-30-13    PVI and CTI ablation by Dr Johney FrameAllred  . Atrial fibrillation ablation N/A 08/30/2013    Procedure: ATRIAL FIBRILLATION ABLATION;  Surgeon: Gardiner RhymeJames D Allred, MD;  Location: [facility] CATH LAB;  Service: Cardiovascular;  Laterality: N/A;    Current Outpatient Prescriptions  Medication Sig Dispense Refill  . Ascorbic Acid (VITAMIN C) 1000 MG tablet Take 2,000 mg by mouth daily.     Marland Kitchen. aspirin EC 81 MG tablet Take 1 tablet (81 mg total) by  mouth daily. 90 tablet 3  . cetirizine (ZYRTEC) 10 MG tablet Take 10 mg by mouth daily as needed for allergies or rhinitis.     . fish oil-omega-3 fatty acids 1000 MG capsule Take 1 g by mouth daily.    . fluticasone (FLONASE) 50 MCG/ACT nasal spray Place 2 sprays into both nostrils daily as needed for allergies. 16 g 11  . glucosamine-chondroitin 500-400 MG tablet Take 2 tablets by mouth daily.    . Ibuprofen (ADVIL) 200 MG CAPS Take 400 mg by mouth daily as needed (back pain).     . Multiple Vitamin (MULTIVITAMIN) capsule Take 1 capsule by mouth daily.    . vitamin E 400 UNIT capsule Take 400 Units by mouth daily.     No current facility-administered medications for this visit.    Allergies:   Review of patient's allergies indicates no known allergies.   Social History: Social History   Social History  . Marital Status: Married    Spouse Name: N/A  . Number of Children: N/A  . Years of Education: N/A   Occupational History  . Not on file.   Social History Main Topics  . Smoking status: Never Smoker   . Smokeless tobacco: Never Used  . Alcohol Use: 4.2 oz/week    7 Standard drinks or equivalent per week     Comment: 1-2 drinks per day  . Drug Use:  No  . Sexual Activity: Yes   Other Topics Concern  . Not on file   Social History Narrative   Lives in [place]. Works as Water engineer at the [employer]    Family History: Family History  Problem Relation Age of Onset  . Stroke Mother   . Heart attack Maternal Grandmother   . Heart attack Maternal Grandfather     Review of Systems: All other systems reviewed and are otherwise negative except as noted above.   Physical Exam: VS:  BP 120/70 mmHg  Pulse 80  Ht  (1.854 m)  Wt 227 lb 3.2 oz (103.057 kg)  BMI 29.98 kg/m2 , BMI Body mass index is 29.98 kg/(m^2). Wt Readings from Last 3 Encounters:  06/22/15 227 lb 3.2 oz (103.057 kg)  01/29/15 233 lb (105.688 kg)  10/25/14 238 lb 9.6 oz (108.228 kg)      GEN- The patient is well appearing, alert and oriented x 3 today.   HEENT: normocephalic, atraumatic; sclera clear, conjunctiva pink; hearing intact; oropharynx clear; neck supple  Lungs- Clear to ausculation bilaterally, normal work of breathing.  No wheezes, rales, rhonchi Heart- Regular rate and rhythm, no murmurs, rubs or gallops  GI- soft, non-tender, non-distended, bowel sounds present  Extremities- no clubbing, cyanosis, or edema; DP/PT/radial pulses 2+ bilaterally MS- no significant deformity or atrophy Skin- warm and dry, no rash or lesion  Psych- euthymic mood, full affect Neuro- strength and sensation are intact   EKG:  EKG is ordered today. EKG today demonstrates SR  Recent Labs: 07/31/2014: ALT 25; BUN 17; Creatinine, Ser 0.80; Hemoglobin 14.7; Platelets 165.0; Potassium 4.5; Sodium 138; TSH 0.66    Other studies Reviewed: Additional studies/ records that were reviewed today include: Dr Jenel Lucks office notes  Assessment and Plan: 1.  Paroxysmal atrial fibrillation Maintaining SR post ablation 08/2013 off AAD therapy CHADS2VASC is 0 - discontinue ASA Discontinue Diltiazem   Current medicines are reviewed at length with the patient today.   The patient does not have concerns regarding his medicines.  The following changes were made today:  none  Labs/ tests ordered today include: none   Disposition:   Follow up with Dr Johney Frame 6 months   Signed, Gypsy Balsam, NP 06/22/2015 9:51 AM   [facility] [address] [place] [address] [phone] (office) [phone] (fax)

## 2015-06-22 NOTE — Patient Instructions (Signed)
Medication Instructions:    STOP DILTIAZEM  AND ASPIRIN    If you need a refill on your cardiac medications before your next appointment, please call your pharmacy.  Labwork: NONE ORDER TODAY    Testing/Procedures:  NONE ORDER TODAY    Follow-Up:  Your physician wants you to follow-up in:  IN  6  MONTHS WITH DR Johney FrameALLRED  You will receive a reminder letter in the mail two months in advance. If you don't receive a letter, please call our office to schedule the follow-up appointment.      Any Other Special Instructions Will Be Listed Below (If Applicable).

## 2015-06-22 NOTE — Addendum Note (Signed)
Addended by: Oleta MouseVERTON, Lujuana Kapler M on: 06/22/2015 09:56 AM   Modules accepted: Orders

## 2015-06-27 NOTE — Addendum Note (Signed)
Addended by: Reesa ChewJONES, Liliane Mallis G on: 06/27/2015 11:48 AM   Modules accepted: Orders

## 2015-07-30 ENCOUNTER — Ambulatory Visit: Payer: Managed Care, Other (non HMO) | Admitting: Internal Medicine

## 2016-01-15 ENCOUNTER — Encounter: Payer: Self-pay | Admitting: Internal Medicine

## 2016-01-28 ENCOUNTER — Encounter (INDEPENDENT_AMBULATORY_CARE_PROVIDER_SITE_OTHER): Payer: Self-pay

## 2016-01-28 ENCOUNTER — Encounter: Payer: Self-pay | Admitting: Internal Medicine

## 2016-01-28 ENCOUNTER — Ambulatory Visit (INDEPENDENT_AMBULATORY_CARE_PROVIDER_SITE_OTHER): Payer: Managed Care, Other (non HMO) | Admitting: Internal Medicine

## 2016-01-28 VITALS — BP 120/86 | HR 79 | Ht 73.0 in | Wt 235.0 lb

## 2016-01-28 DIAGNOSIS — I480 Paroxysmal atrial fibrillation: Secondary | ICD-10-CM | POA: Diagnosis not present

## 2016-01-28 NOTE — Patient Instructions (Addendum)

## 2016-01-28 NOTE — Progress Notes (Signed)
PCP: Nelwyn SalisburyFRY,STEPHEN A, MD Primary Cardiologist:  Previously [name]  Shawn Hurley is a 50 y.o. male who presents today for routine electrophysiology followup.  Since last being seen in our clinic, the patient reports doing very well.   He is pleased with his current health and is exercising without limitation.  His daughter is at [facility] and his second daughter has just been accepted to [facility].  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  Today, he denies symptoms of palpitations, chest pain, shortness of breath,  lower extremity edema, dizziness, presyncope, or syncope.  The patient is otherwise without complaint today.   Past Medical History:  Diagnosis Date  . Allergy   . Atrial flutter (HCC)    typical appearing  . Ejection fraction    EF 55-60%, echo, May, 2013  . High risk medication use    Flecainide  . Persistent atrial fibrillation (HCC)    a. atrial fibrillation, April, 2013, b. 07/2011 Echo: EF 55-60%   Past Surgical History:  Procedure Laterality Date  . ABLATION  08-30-13   PVI and CTI ablation by Dr Johney FrameAllred  . ATRIAL FIBRILLATION ABLATION N/A 08/30/2013   Procedure: ATRIAL FIBRILLATION ABLATION;  Surgeon: Gardiner RhymeJames D Aerionna Moravek, MD;  Location: [facility];  Service: Cardiovascular;  Laterality: N/A;  . HERNIA REPAIR  2000   umbilical hernia  . TEE WITHOUT CARDIOVERSION N/A 08/29/2013   Procedure: TRANSESOPHAGEAL ECHOCARDIOGRAM (TEE);  Surgeon: Lars MassonKatarina H Nelson, MD;  [facility];  Service: Cardiovascular;  Laterality: N/A;  . TONSILLECTOMY AND ADENOIDECTOMY    . WRIST SURGERY  1979   left wrist, to drain an infection     ROS- all systems are reviewed and negatives except as per HPI above  Current Outpatient Prescriptions  Medication Sig Dispense Refill  . Ascorbic Acid (VITAMIN C) 1000 MG tablet Take 2,000 mg by mouth daily.     . cetirizine (ZYRTEC) 10 MG tablet Take 10 mg by mouth daily as needed for allergies or rhinitis.     . fish oil-omega-3 fatty acids  1000 MG capsule Take 1 g by mouth daily.    . fluticasone (FLONASE) 50 MCG/ACT nasal spray Place 2 sprays into both nostrils daily as needed for allergies. 16 g 11  . glucosamine-chondroitin 500-400 MG tablet Take 2 tablets by mouth daily.    . Ibuprofen (ADVIL) 200 MG CAPS Take 400 mg by mouth daily as needed (back pain).     . Multiple Vitamin (MULTIVITAMIN) capsule Take 1 capsule by mouth daily.    . vitamin E 400 UNIT capsule Take 400 Units by mouth daily.     No current facility-administered medications for this visit.     Physical Exam: Vitals:   01/28/16 1207  BP: 120/86  Pulse: 79  Weight: 235 lb (106.6 kg)  Height: 6\' 1"  (1.854 m)    GEN- The patient is well appearing, alert and oriented x 3 today.   Head- normocephalic, atraumatic Eyes-  Sclera clear, conjunctiva pink Ears- hearing intact Oropharynx- clear Lungs- Clear to ausculation bilaterally, normal work of breathing Heart- Regular rate and rhythm, no murmurs, rubs or gallops, PMI not laterally displaced GI- soft, NT, ND, + BS Extremities- no clubbing, cyanosis, or edema  ekg today reveals sinus rhythm with PACs, normal ekg  Assessment and Plan:  1. Paroxysmal atrial fibrillation Remains in sinus off of AAD therapy chads2vasc score is 0.  Return in 12 months  Hillis RangeJames Kiya Eno MD, [facility] 01/28/2016  12:37 PM

## 2016-09-01 ENCOUNTER — Ambulatory Visit (INDEPENDENT_AMBULATORY_CARE_PROVIDER_SITE_OTHER): Payer: Managed Care, Other (non HMO) | Admitting: Family Medicine

## 2016-09-01 ENCOUNTER — Encounter: Payer: Self-pay | Admitting: Family Medicine

## 2016-09-01 VITALS — BP 120/88 | HR 89 | Temp 98.6°F | Ht 73.0 in | Wt 234.0 lb

## 2016-09-01 DIAGNOSIS — M26609 Unspecified temporomandibular joint disorder, unspecified side: Secondary | ICD-10-CM | POA: Diagnosis not present

## 2016-09-01 DIAGNOSIS — M26659 Arthropathy of unspecified temporomandibular joint: Secondary | ICD-10-CM

## 2016-09-01 NOTE — Progress Notes (Signed)
   Subjective:    Patient ID: Shawn Hurley, male    DOB: 12/26/1965, 51 y.o.   MRN: [id number]  HPI Here for 2 weeks of intermittent pain in the right jaw and right ear. No sinus congestion. This started after he ad a dental cleaning. No pain on chewing.    Review of Systems  Constitutional: Negative.   HENT: Positive for ear pain. Negative for congestion, facial swelling, hearing loss, postnasal drip, sinus pain, sinus pressure and sore throat.   Eyes: Negative.   Respiratory: Negative.        Objective:   Physical Exam  Constitutional: He appears well-developed and well-nourished.  HENT:  Right Ear: External ear normal.  Left Ear: External ear normal.  Nose: Nose normal.  Mouth/Throat: Oropharynx is clear and moist.  Tender over the right TMJ. No crepitus.  Eyes: Conjunctivae are normal.  Neck: No thyromegaly present.  Pulmonary/Chest: Effort normal and breath sounds normal.  Lymphadenopathy:    He has no cervical adenopathy.          Assessment & Plan:  TMJ pain. Rest the jaw, avoid taking big bites of food. Use ice packs. Take 2 Aleve tablets bid for a week or two.  Gershon CraneStephen Fry, MD

## 2016-09-01 NOTE — Patient Instructions (Signed)
WE NOW OFFER   [facility] FAST TRACK!!!  SAME DAY Appointments for ACUTE CARE  Such as: Sprains, Injuries, cuts, abrasions, rashes, muscle pain, joint pain, back pain Colds, flu, sore throats, headache, allergies, cough, fever  Ear pain, sinus and eye infections Abdominal pain, nausea, vomiting, diarrhea, upset stomach Animal/insect bites  3 Easy Ways to Schedule: Walk-In Scheduling Call in scheduling Mychart Sign-up: [email or web address]         

## 2017-04-20 ENCOUNTER — Ambulatory Visit: Payer: 59 | Admitting: Internal Medicine

## 2017-04-20 ENCOUNTER — Encounter: Payer: Self-pay | Admitting: Internal Medicine

## 2017-04-20 VITALS — BP 122/82 | HR 78 | Ht 73.0 in | Wt 242.0 lb

## 2017-04-20 DIAGNOSIS — I480 Paroxysmal atrial fibrillation: Secondary | ICD-10-CM

## 2017-04-20 NOTE — Patient Instructions (Addendum)
Medication Instructions:  Your physician recommends that you continue on your current medications as directed. Please refer to the Current Medication list given to you today.    Labwork: None ordered   Testing/Procedures: None ordered   Follow-Up:Your physician recommends that you schedule a follow-up appointment in: 6 WEEKS WITH DR. ALLRED   Any Other Special Instructions Will Be Listed Below (If Applicable).  Dr.  Johney FrameAllred recommends you get Apple watch Version 4    If you need a refill on your cardiac medications before your next appointment, please call your pharmacy.

## 2017-04-20 NOTE — Progress Notes (Signed)
PCP: Nelwyn SalisburyFry, Stephen A, MD   Primary EP: Dr Johney FrameAllred  Jobe MarkerGregory Hurley is a 52 y.o. male who presents today for routine electrophysiology followup.  Since last being seen in our clinic, the patient reports doing very well.  He is in afib today.  He has had some palpitations but energy is good.   He is still exercising. Today, he denies symptoms of chest pain, shortness of breath,  lower extremity edema, dizziness, presyncope, or syncope.  The patient is otherwise without complaint today.   Past Medical History:  Diagnosis Date  . Allergy   . Atrial flutter (HCC)    typical appearing  . Ejection fraction    EF 55-60%, echo, May, 2013  . High risk medication use    Flecainide  . Persistent atrial fibrillation (HCC)    a. atrial fibrillation, April, 2013, b. 07/2011 Echo: EF 55-60%   Past Surgical History:  Procedure Laterality Date  . ABLATION  08-30-13   PVI and CTI ablation by Dr Johney FrameAllred  . ATRIAL FIBRILLATION ABLATION N/A 08/30/2013   Procedure: ATRIAL FIBRILLATION ABLATION;  Surgeon: Gardiner RhymeJames D Mirren Gest, MD;  Location: [facility];  Service: Cardiovascular;  Laterality: N/A;  . HERNIA REPAIR  2000   umbilical hernia  . TEE WITHOUT CARDIOVERSION N/A 08/29/2013   Procedure: TRANSESOPHAGEAL ECHOCARDIOGRAM (TEE);  Surgeon: Lars MassonKatarina H Nelson, MD;  [facility];  Service: Cardiovascular;  Laterality: N/A;  . TONSILLECTOMY AND ADENOIDECTOMY    . WRIST SURGERY  1979   left wrist, to drain an infection     ROS- all systems are reviewed and negatives except as per HPI above  Current Outpatient Medications  Medication Sig Dispense Refill  . Ascorbic Acid (VITAMIN C) 1000 MG tablet Take 2,000 mg by mouth daily.     . cetirizine (ZYRTEC) 10 MG tablet Take 10 mg by mouth daily as needed for allergies or rhinitis.     . fish oil-omega-3 fatty acids 1000 MG capsule Take 1 g by mouth daily.    . fluticasone (FLONASE) 50 MCG/ACT nasal spray Place 2 sprays into both nostrils daily as needed for  allergies. 16 g 11  . glucosamine-chondroitin 500-400 MG tablet Take 2 tablets by mouth daily.    . Ibuprofen (ADVIL) 200 MG CAPS Take 400 mg by mouth daily as needed (back pain).     . Multiple Vitamin (MULTIVITAMIN) capsule Take 1 capsule by mouth daily.    . vitamin E 400 UNIT capsule Take 400 Units by mouth daily.     No current facility-administered medications for this visit.     Physical Exam: Vitals:   04/20/17 1058  BP: 122/82  Pulse: 78  Weight: 242 lb (109.8 kg)  Height: 6\' 1"  (1.854 m)    GEN- The patient is well appearing, alert and oriented x 3 today.   Head- normocephalic, atraumatic Eyes-  Sclera clear, conjunctiva pink Ears- hearing intact Oropharynx- clear Lungs- Clear to ausculation bilaterally, normal work of breathing Heart- irregular rate and rhythm, no murmurs, rubs or gallops, PMI not laterally displaced GI- soft, NT, ND, + BS Extremities- no clubbing, cyanosis, or edema  EKG tracing ordered today is personally reviewed and shows afib, V rate 78 bpm  Assessment and Plan:  1. Paroxysmal atrial fibrillation He has returned to afib The exact duration is unknown chads2vasc score is 0.  He is rate controlled.  I have therefore not made any changes today. We discussed AppleWach version 4 as a good option for him  to keep track of his rhythm.  I will see him back in 6 weeks. We will discuss further options pending further clarification of his AF burden.  I would not advise 30 day monitor for him.  Could consider implantable loop recorder as an alternative to [employer].  Return in 6 weeks  Hillis Range MD, [facility] 04/20/2017 11:46 AM

## 2017-05-11 ENCOUNTER — Encounter: Payer: Self-pay | Admitting: Family Medicine

## 2017-05-11 ENCOUNTER — Encounter: Payer: Self-pay | Admitting: Internal Medicine

## 2017-05-11 ENCOUNTER — Ambulatory Visit (INDEPENDENT_AMBULATORY_CARE_PROVIDER_SITE_OTHER): Payer: 59 | Admitting: Family Medicine

## 2017-05-11 VITALS — BP 118/68 | HR 92 | Temp 97.7°F | Ht 73.0 in | Wt 244.4 lb

## 2017-05-11 DIAGNOSIS — Z0000 Encounter for general adult medical examination without abnormal findings: Secondary | ICD-10-CM | POA: Diagnosis not present

## 2017-05-11 LAB — HEPATIC FUNCTION PANEL
ALBUMIN: 4.3 g/dL (ref 3.5–5.2)
ALT: 23 U/L (ref 0–53)
AST: 15 U/L (ref 0–37)
Alkaline Phosphatase: 58 U/L (ref 39–117)
Bilirubin, Direct: 0.1 mg/dL (ref 0.0–0.3)
Total Bilirubin: 0.6 mg/dL (ref 0.2–1.2)
Total Protein: 6.9 g/dL (ref 6.0–8.3)

## 2017-05-11 LAB — POC URINALSYSI DIPSTICK (AUTOMATED)
Bilirubin, UA: NEGATIVE
Blood, UA: NEGATIVE
Glucose, UA: NEGATIVE
Ketones, UA: NEGATIVE
LEUKOCYTES UA: NEGATIVE
Nitrite, UA: NEGATIVE
PH UA: 6 (ref 5.0–8.0)
PROTEIN UA: NEGATIVE
Spec Grav, UA: >=1.03 — AB (ref 1.010–1.025)
UROBILINOGEN UA: 0.2 U/dL

## 2017-05-11 LAB — PSA: PSA: 2.2 ng/mL (ref 0.10–4.00)

## 2017-05-11 LAB — BASIC METABOLIC PANEL
BUN: 17 mg/dL (ref 6–23)
CHLORIDE: 105 meq/L (ref 96–112)
CO2: 27 mEq/L (ref 19–32)
Calcium: 9.1 mg/dL (ref 8.4–10.5)
Creatinine, Ser: 0.9 mg/dL (ref 0.40–1.50)
GFR: 93.98 mL/min (ref >60.00)
Glucose, Bld: 91 mg/dL (ref 70–99)
Potassium: 4.3 mEq/L (ref 3.5–5.1)
Sodium: 139 mEq/L (ref 135–145)

## 2017-05-11 LAB — CBC WITH DIFFERENTIAL/PLATELET
BASOS ABS: 0 10*3/uL (ref 0.0–0.1)
Basophils Relative: 0.7 % (ref 0.0–3.0)
EOS ABS: 0.1 10*3/uL (ref 0.0–0.7)
Eosinophils Relative: 2.9 % (ref 0.0–5.0)
HEMATOCRIT: 42.7 % (ref 39.0–52.0)
Hemoglobin: 14.6 g/dL (ref 13.0–17.0)
Lymphocytes Relative: 31 % (ref 12.0–46.0)
Lymphs Abs: 1.3 10*3/uL (ref 0.7–4.0)
MCHC: 34.1 g/dL (ref 30.0–36.0)
MCV: 91.7 fl (ref 78.0–100.0)
Monocytes Absolute: 0.4 10*3/uL (ref 0.1–1.0)
Monocytes Relative: 8.5 % (ref 3.0–12.0)
NEUTROS PCT: 56.9 % (ref 43.0–77.0)
Neutro Abs: 2.4 10*3/uL (ref 1.4–7.7)
Platelets: 168 10*3/uL (ref 150.0–400.0)
RBC: 4.66 Mil/uL (ref 4.22–5.81)
RDW: 13.1 % (ref 11.5–15.5)
WBC: 4.2 10*3/uL (ref 4.0–10.5)

## 2017-05-11 LAB — LIPID PANEL
CHOL/HDL RATIO: 5
Cholesterol: 183 mg/dL (ref 0–200)
HDL: 38.2 mg/dL — ABNORMAL LOW (ref >39.00)
LDL Cholesterol: 119 mg/dL — ABNORMAL HIGH (ref 0–99)
NonHDL: 145.01
Triglycerides: 131 mg/dL (ref 0.0–149.0)
VLDL: 26.2 mg/dL (ref 0.0–40.0)

## 2017-05-11 LAB — TSH: TSH: 0.98 u[IU]/mL (ref 0.35–4.50)

## 2017-05-11 NOTE — Progress Notes (Signed)
   Subjective:    Patient ID: Shawn Hurley, male    DOB: 05/03/1965, 52 y.o.   MRN: [id number]  HPI Here for a well exam. He feels great. He has been back in atrial fibrillation however and he saw Dr. Johney FrameAllred for this on 04-20-17. He is not symptomatic when he is in fibrillation however, and his rate remains controlled. He was advised to purchase an Apple watch, which he has done, as a way to monitor his rhythm and rate. He is exercising.    Review of Systems  Constitutional: Negative.   HENT: Negative.   Eyes: Negative.   Respiratory: Negative.   Cardiovascular: Negative.   Gastrointestinal: Negative.   Genitourinary: Negative.   Musculoskeletal: Negative.   Skin: Negative.   Neurological: Negative.   Psychiatric/Behavioral: Negative.        Objective:   Physical Exam  Constitutional: He is oriented to person, place, and time. He appears well-developed and well-nourished. No distress.  HENT:  Head: Normocephalic and atraumatic.  Right Ear: External ear normal.  Left Ear: External ear normal.  Nose: Nose normal.  Mouth/Throat: Oropharynx is clear and moist. No oropharyngeal exudate.  Eyes: Conjunctivae and EOM are normal. Pupils are equal, round, and reactive to light. Right eye exhibits no discharge. Left eye exhibits no discharge. No scleral icterus.  Neck: Neck supple. No JVD present. No tracheal deviation present. No thyromegaly present.  Cardiovascular: Normal rate, normal heart sounds and intact distal pulses. Exam reveals no gallop and no friction rub.  No murmur heard. Irregular rhythm   Pulmonary/Chest: Effort normal and breath sounds normal. No respiratory distress. He has no wheezes. He has no rales. He exhibits no tenderness.  Abdominal: Soft. Bowel sounds are normal. He exhibits no distension and no mass. There is no tenderness. There is no rebound and no guarding.  Genitourinary: Rectum normal, prostate normal and penis normal. Rectal exam shows guaiac negative stool.  No penile tenderness.  Musculoskeletal: Normal range of motion. He exhibits no edema or tenderness.  Lymphadenopathy:    He has no cervical adenopathy.  Neurological: He is alert and oriented to person, place, and time. He has normal reflexes. No cranial nerve deficit. He exhibits normal muscle tone. Coordination normal.  Skin: Skin is warm and dry. No rash noted. He is not diaphoretic. No erythema. No pallor.  Psychiatric: He has a normal mood and affect. His behavior is normal. Judgment and thought content normal.          Assessment & Plan:  Well exam. We discussed diet and exercise. Get fasting labs. Set up a colonoscopy.  Gershon CraneStephen Sonya Gunnoe, MD

## 2017-05-15 ENCOUNTER — Telehealth: Payer: Self-pay | Admitting: Family Medicine

## 2017-05-15 NOTE — Telephone Encounter (Signed)
Pt given results per notes of Dr. Clent RidgesFry on 05/13/17, patient verbalized understanding. Unable to document in result note due to result note not being routed to [facility].

## 2017-05-15 NOTE — Telephone Encounter (Signed)
Loratadine and Flonase refill Last OV: 05/11/17 PCP: Dr. Clent RidgesFry Pharmacy: [facility] [place] [address] [place] [phone] (Phone) [phone] (Fax)   Loratadine will need a new prescription, not listed on profile.

## 2017-05-15 NOTE — Telephone Encounter (Signed)
Copied from CRM [phone]. Topic: Quick Communication - Rx Refill/Question >> May 15, 2017  1:36 PM Crist InfanteHarrald, Kathy J wrote: Medication: nasonex spray                    Loratadine  Pt saw Dr Clent RidgesFry 2/18 and forgot to ask for meds to use for seasonal allergies [facility] [place] - [address] [place] [phone] (Phone) [phone] (Fax)

## 2017-05-18 MED ORDER — FLUTICASONE PROPIONATE 50 MCG/ACT NA SUSP: 2.0000 | Freq: Every day | NASAL | 3 refills | Status: DC | PRN

## 2017-06-05 NOTE — Telephone Encounter (Signed)
Pt called to check on status of refill  -he states  He needs it written for 2 bottles for the  flonase and   For 100 pills on the loratadine

## 2017-06-08 ENCOUNTER — Other Ambulatory Visit: Payer: Self-pay

## 2017-06-08 NOTE — Telephone Encounter (Signed)
[facility]. Neither medications listed on pt's current medication listed.   Last seen 05/11/2017

## 2017-06-09 MED ORDER — FLUTICASONE PROPIONATE 50 MCG/ACT NA SUSP: 2.0000 | Freq: Every day | NASAL | 3 refills | Status: DC | PRN

## 2017-06-09 MED ORDER — LORATADINE 10 MG PO TABS: 10.0000 mg | ORAL_TABLET | Freq: Every day | ORAL | 3 refills | Status: DC

## 2017-06-09 NOTE — Telephone Encounter (Signed)
These were sent in  

## 2017-06-09 NOTE — Addendum Note (Signed)
Addended by: Gershon CraneFRY, Kedrick Mcnamee A on: 06/09/2017 08:13 AM   Modules accepted: Orders

## 2017-06-09 NOTE — Telephone Encounter (Signed)
Called and spoke with pt. Pt advised and voiced understanding.  

## 2017-06-15 ENCOUNTER — Ambulatory Visit (INDEPENDENT_AMBULATORY_CARE_PROVIDER_SITE_OTHER): Payer: 59 | Admitting: Internal Medicine

## 2017-06-15 ENCOUNTER — Encounter: Payer: Self-pay | Admitting: Internal Medicine

## 2017-06-15 ENCOUNTER — Telehealth: Payer: Self-pay | Admitting: *Deleted

## 2017-06-15 VITALS — BP 138/84 | HR 118 | Ht 73.0 in | Wt 245.2 lb

## 2017-06-15 DIAGNOSIS — I481 Persistent atrial fibrillation: Secondary | ICD-10-CM | POA: Diagnosis not present

## 2017-06-15 DIAGNOSIS — I480 Paroxysmal atrial fibrillation: Secondary | ICD-10-CM | POA: Diagnosis not present

## 2017-06-15 DIAGNOSIS — I4819 Other persistent atrial fibrillation: Secondary | ICD-10-CM

## 2017-06-15 MED ORDER — RIVAROXABAN 20 MG PO TABS: 20.0000 mg | ORAL_TABLET | Freq: Every day | ORAL | 11 refills | Status: DC

## 2017-06-15 MED ORDER — DILTIAZEM HCL 30 MG PO TABS: ORAL_TABLET | ORAL | 3 refills | Status: DC

## 2017-06-15 MED ORDER — METOPROLOL TARTRATE 50 MG PO TABS: ORAL_TABLET | ORAL | 0 refills | Status: DC

## 2017-06-15 NOTE — H&P (View-Only) (Signed)
PCP: Nelwyn SalisburyFry, Stephen A, MD   Primary EP: Dr Johney FrameAllred  Jobe MarkerGregory Hurley is a 52 y.o. male who presents today for routine electrophysiology followup.  Since last being seen in our clinic, the patient reports doing reasonably well.  His apple watch reveals that he has pretty much been in AF since I saw him last.  He reports elevated heart rates with activity.  He also has fatigue with exercise.  Today, he denies symptoms of palpitations, chest pain, shortness of breath,  lower extremity edema, dizziness, presyncope, or syncope.  The patient is otherwise without complaint today.   Past Medical History:  Diagnosis Date  . Allergy   . Atrial flutter (HCC)    typical appearing  . Ejection fraction    EF 55-60%, echo, May, 2013  . High risk medication use    Flecainide  . Persistent atrial fibrillation (HCC)    a. atrial fibrillation, April, 2013, b. 07/2011 Echo: EF 55-60%   Past Surgical History:  Procedure Laterality Date  . ABLATION  08-30-13   PVI and CTI ablation by Dr Johney FrameAllred  . ATRIAL FIBRILLATION ABLATION N/A 08/30/2013   Procedure: ATRIAL FIBRILLATION ABLATION;  Surgeon: Gardiner RhymeJames D Adaya Garmany, MD;  Location: [facility];  Service: Cardiovascular;  Laterality: N/A;  . HERNIA REPAIR  2000   umbilical hernia  . TEE WITHOUT CARDIOVERSION N/A 08/29/2013   Procedure: TRANSESOPHAGEAL ECHOCARDIOGRAM (TEE);  Surgeon: Lars MassonKatarina H Nelson, MD;  [facility];  Service: Cardiovascular;  Laterality: N/A;  . TONSILLECTOMY AND ADENOIDECTOMY    . WRIST SURGERY  1979   left wrist, to drain an infection     ROS- all systems are reviewed and negatives except as per HPI above  Current Outpatient Medications  Medication Sig Dispense Refill  . Ascorbic Acid (VITAMIN C) 1000 MG tablet Take 2,000 mg by mouth daily.     . fish oil-omega-3 fatty acids 1000 MG capsule Take 1 g by mouth daily.    . fluticasone (FLONASE) 50 MCG/ACT nasal spray Place 2 sprays into both nostrils daily as needed for allergies. 48 g 3    . Ibuprofen (ADVIL) 200 MG CAPS Take 400 mg by mouth daily as needed (back pain).     Marland Kitchen. loratadine (CLARITIN) 10 MG tablet Take 1 tablet (10 mg total) by mouth daily. 100 tablet 3  . Multiple Vitamin (MULTIVITAMIN) capsule Take 1 capsule by mouth daily.    . vitamin E 400 UNIT capsule Take 400 Units by mouth daily.     No current facility-administered medications for this visit.     Physical Exam: Vitals:   06/15/17 1431  BP: 138/84  Pulse: (!) 118  SpO2: 98%  Weight: 245 lb 3.2 oz (111.2 kg)  Height: 6\' 1"  (1.854 m)    GEN- The patient is well appearing, alert and oriented x 3 today.   Head- normocephalic, atraumatic Eyes-  Sclera clear, conjunctiva pink Ears- hearing intact Oropharynx- clear Lungs- Clear to ausculation bilaterally, normal work of breathing Heart- irregular rate and rhythm, no murmurs, rubs or gallops, PMI not laterally displaced GI- soft, NT, ND, + BS Extremities- no clubbing, cyanosis, or edema  EKG tracing ordered today is personally reviewed and shows afib, V rate 118 bpm  Assessment and Plan:  1. Persistent afib chads2vasc score is 0.  V rates elevated today Will start cardizem 30mg  q6h prn Therapeutic strategies for afib including medicine and ablation were discussed in detail with the patient today. Risk, benefits, and alternatives to EP  study and radiofrequency ablation for afib were also discussed in detail today. These risks include but are not limited to stroke, bleeding, vascular damage, tamponade, perforation, damage to the esophagus, lungs, and other structures, pulmonary vein stenosis, worsening renal function, and death. The patient understands these risk and wishes to proceed.  We will therefore proceed with catheter ablation once the patient has been adequately anticoagulated.  Start xarelto 20mg  daily today.  Cardiac CT and echo prior to ablation Carto, ICE, and anesthesia are requested for the procedure.   Hillis Range MD,  [facility] 06/15/2017 2:53 PM

## 2017-06-15 NOTE — Progress Notes (Signed)
PCP: Nelwyn SalisburyFry, Stephen A, MD   Primary EP: Dr Shawn Hurley  Shawn Hurley is a 52 y.o. male who presents today for routine electrophysiology followup.  Since last being seen in our clinic, the patient reports doing reasonably well.  His apple watch reveals that he has pretty much been in AF since I saw him last.  He reports elevated heart rates with activity.  He also has fatigue with exercise.  Today, he denies symptoms of palpitations, chest pain, shortness of breath,  lower extremity edema, dizziness, presyncope, or syncope.  The patient is otherwise without complaint today.   Past Medical History:  Diagnosis Date  . Allergy   . Atrial flutter (HCC)    typical appearing  . Ejection fraction    EF 55-60%, echo, May, 2013  . High risk medication use    Flecainide  . Persistent atrial fibrillation (HCC)    a. atrial fibrillation, April, 2013, b. 07/2011 Echo: EF 55-60%   Past Surgical History:  Procedure Laterality Date  . ABLATION  08-30-13   PVI and CTI ablation by Dr Shawn Hurley  . ATRIAL FIBRILLATION ABLATION N/A 08/30/2013   Procedure: ATRIAL FIBRILLATION ABLATION;  Surgeon: Gardiner RhymeJames D Kayela Humphres, MD;  Location: [facility];  Service: Cardiovascular;  Laterality: N/A;  . HERNIA REPAIR  2000   umbilical hernia  . TEE WITHOUT CARDIOVERSION N/A 08/29/2013   Procedure: TRANSESOPHAGEAL ECHOCARDIOGRAM (TEE);  Surgeon: Lars MassonKatarina H Nelson, MD;  [facility];  Service: Cardiovascular;  Laterality: N/A;  . TONSILLECTOMY AND ADENOIDECTOMY    . WRIST SURGERY  1979   left wrist, to drain an infection     ROS- all systems are reviewed and negatives except as per HPI above  Current Outpatient Medications  Medication Sig Dispense Refill  . Ascorbic Acid (VITAMIN C) 1000 MG tablet Take 2,000 mg by mouth daily.     . fish oil-omega-3 fatty acids 1000 MG capsule Take 1 g by mouth daily.    . fluticasone (FLONASE) 50 MCG/ACT nasal spray Place 2 sprays into both nostrils daily as needed for allergies. 48 g 3    . Ibuprofen (ADVIL) 200 MG CAPS Take 400 mg by mouth daily as needed (back pain).     Marland Kitchen. loratadine (CLARITIN) 10 MG tablet Take 1 tablet (10 mg total) by mouth daily. 100 tablet 3  . Multiple Vitamin (MULTIVITAMIN) capsule Take 1 capsule by mouth daily.    . vitamin E 400 UNIT capsule Take 400 Units by mouth daily.     No current facility-administered medications for this visit.     Physical Exam: Vitals:   06/15/17 1431  BP: 138/84  Pulse: (!) 118  SpO2: 98%  Weight: 245 lb 3.2 oz (111.2 kg)  Height: 6\' 1"  (1.854 m)    GEN- The patient is well appearing, alert and oriented x 3 today.   Head- normocephalic, atraumatic Eyes-  Sclera clear, conjunctiva pink Ears- hearing intact Oropharynx- clear Lungs- Clear to ausculation bilaterally, normal work of breathing Heart- irregular rate and rhythm, no murmurs, rubs or gallops, PMI not laterally displaced GI- soft, NT, ND, + BS Extremities- no clubbing, cyanosis, or edema  EKG tracing ordered today is personally reviewed and shows afib, V rate 118 bpm  Assessment and Plan:  1. Persistent afib chads2vasc score is 0.  V rates elevated today Will start cardizem 30mg  q6h prn Therapeutic strategies for afib including medicine and ablation were discussed in detail with the patient today. Risk, benefits, and alternatives to EP  study and radiofrequency ablation for afib were also discussed in detail today. These risks include but are not limited to stroke, bleeding, vascular damage, tamponade, perforation, damage to the esophagus, lungs, and other structures, pulmonary vein stenosis, worsening renal function, and death. The patient understands these risk and wishes to proceed.  We will therefore proceed with catheter ablation once the patient has been adequately anticoagulated.  Start xarelto 20mg  daily today.  Cardiac CT and echo prior to ablation Carto, ICE, and anesthesia are requested for the procedure.   Hillis Range MD,  [facility] 06/15/2017 2:53 PM

## 2017-06-15 NOTE — Telephone Encounter (Signed)
t saw cardio today- pt in A Fib and has a scheduled A Fib Ablation 07-03-17. Pt also started on Xarelto today.  Pt states he was going to call and cancel. Informed pt he needs to have cardiac clearance from cardiology before we RS his colon. Im not sure when cardio will want pt top stop Xarelto but informed has to stop 5 days before colon for us,  Pt to discuss with cardiologist. Pt verbalized understanding and will call back to Rs once applicable.  Hilda LiasMarie PV

## 2017-06-15 NOTE — Patient Instructions (Addendum)
Medication Instructions:  Your physician has recommended you make the following change in your medication: 1.   Take Cardizem 30 mg 1-2 tablets as needed by mouth for heart racing.  Take 2 tablets prior to your cardiac CT.  2.  Start Xarelto 20 mg one tablet by mouth daily.  Labwork: You will need lab work within 14 days of your procedure scheduled for July 03, 2017. Please schedule.  Testing/Procedures: Your physician has requested that you have an echocardiogram. Echocardiography is a painless test that uses sound waves to create images of your heart. It provides your doctor with information about the size and shape of your heart and how well your heart's chambers and valves are working. This procedure takes approximately one hour. There are no restrictions for this procedure.  Please schedule for ECHO prior to ablation (07/03/2017)  Your physician has requested that you have cardiac CT. Cardiac computed tomography (CT) is a painless test that uses an x-ray machine to take clear, detailed pictures of your heart. For further information please visit [email or web address]. Please follow instruction sheet as given.-You will get a call from our office to schedule the date for this test.  Schedule Cardiac CT  Your physician has recommended that you have an ablation. Catheter ablation is a medical procedure used to treat some cardiac arrhythmias (irregular heartbeats). During catheter ablation, a long, thin, flexible tube is put into a blood vessel in your groin (upper thigh), or neck. This tube is called an ablation catheter. It is then guided to your heart through the blood vessel. Radio frequency waves destroy small areas of heart tissue where abnormal heartbeats may cause an arrhythmia to start. Please see the instruction sheet given to you today.  Follow-Up: You will follow up with Rudi Cocoonna Carroll, NP with the Atrial fibrillation (Afib) clinic 4 weeks after your ablation.  You will follow up  with Dr. Johney FrameAllred 3 months after your procedure.  CARDIAC CT INSTRUCTIONS:  Please arrive at the [facility] main entrance of [facility]. [facility] [address] [place], [place] [address] [phone]  Proceed to the [facility] Radiology Department (First Floor).  Please follow these instructions carefully (unless otherwise directed):  Hold all erectile dysfunction medications at least 48 hours prior to test.  On the Night Before the Test: . Drink plenty of water. . Do not consume any caffeinated/decaffeinated beverages or chocolate 12 hours prior to your test. . Do not take any antihistamines 12 hours prior to your test. . If you take Metformin do not take 24 hours prior to test. On the Day of the Test: . Drink plenty of water. Do not drink any water within one hour of the test. . Do not eat any food 4 hours prior to the test. . You may take your regular medications prior to the test. . IF NOT ON A BETA BLOCKER - Take 50 mg of lopressor (metoprolol) one hour before the test.  You are on a beta blocker.  Take your nebivolol prior to this procedure. Marland Kitchen. HOLD Furosemide morning of the test.  After the Test: . Drink plenty of water. . After receiving IV contrast, you may experience a mild flushed feeling. This is normal. . On occasion, you may experience a mild rash up to 24 hours after the test. This is not dangerous. If this occurs, you can take Benadryl 25 mg and increase your fluid intake. . If you experience trouble breathing, this can be serious. If it is  severe call 911 IMMEDIATELY. If it is mild, please call our office. . If you take any of these medications: Glipizide/Metformin, Avandament, Glucavance, please do not take 48 hours after completing test.  ABLATION INSTRUCTIONS:  Please arrive at the [facility] main entrance of [facility] hospital at: 5:30 am on July 03, 2017 Do not eat or drink after midnight prior to procedure On the morning of  your procedure do not take any medications. Plan for one night stay.  You will need someone to drive you home at discharge.   Cardiac Ablation Cardiac ablation is a procedure to disable (ablate) a small amount of heart tissue in very specific places. The heart has many electrical connections. Sometimes these connections are abnormal and can cause the heart to beat very fast or irregularly. Ablating some of the problem areas can improve the heart rhythm or return it to normal. Ablation may be done for people who:  Have Wolff-Parkinson-White syndrome.  Have fast heart rhythms (tachycardia).  Have taken medicines for an abnormal heart rhythm (arrhythmia) that were not effective or caused side effects.  Have a high-risk heartbeat that may be life-threatening.  During the procedure, a small incision is made in the neck or the groin, and a long, thin, flexible tube (catheter) is inserted into the incision and moved to the heart. Small devices (electrodes) on the tip of the catheter will send out electrical currents. A type of X-ray (fluoroscopy) will be used to help guide the catheter and to provide images of the heart. Tell a health care provider about:  Any allergies you have.  All medicines you are taking, including vitamins, herbs, eye drops, creams, and over-the-counter medicines.  Any problems you or family members have had with anesthetic medicines.  Any blood disorders you have.  Any surgeries you have had.  Any medical conditions you have, such as kidney failure.  Whether you are pregnant or may be pregnant. What are the risks? Generally, this is a safe procedure. However, problems may occur, including:  Infection.  Bruising and bleeding at the catheter insertion site.  Bleeding into the chest, especially into the sac that surrounds the heart. This is a serious complication.  Stroke or blood clots.  Damage to other structures or organs.  Allergic reaction to medicines  or dyes.  Need for a permanent pacemaker if the normal electrical system is damaged. A pacemaker is a small computer that sends electrical signals to the heart and helps your heart beat normally.  The procedure not being fully effective. This may not be recognized until months later. Repeat ablation procedures are sometimes required.  What happens before the procedure?  Follow instructions from your health care provider about eating or drinking restrictions.  Ask your health care provider about: ? Changing or stopping your regular medicines. This is especially important if you are taking diabetes medicines or blood thinners. ? Taking medicines such as aspirin and ibuprofen. These medicines can thin your blood. Do not take these medicines before your procedure if your health care provider instructs you not to.  Plan to have someone take you home from the hospital or clinic.  If you will be going home right after the procedure, plan to have someone with you for 24 hours. What happens during the procedure?  To lower your risk of infection: ? Your health care team will wash or sanitize their hands. ? Your skin will be washed with soap. ? Hair may be removed from the incision area.  An IV tube will be inserted into one of your veins.  You will be given a medicine to help you relax (sedative).  The skin on your neck or groin will be numbed.  An incision will be made in your neck or your groin.  A needle will be inserted through the incision and into a large vein in your neck or groin.  A catheter will be inserted into the needle and moved to your heart.  Dye may be injected through the catheter to help your surgeon see the area of the heart that needs treatment.  Electrical currents will be sent from the catheter to ablate heart tissue in desired areas. There are three types of energy that may be used to ablate heart tissue: ? Heat (radiofrequency energy). ? Laser energy. ? Extreme  cold (cryoablation).  When the necessary tissue has been ablated, the catheter will be removed.  Pressure will be held on the catheter insertion area to prevent excessive bleeding.  A bandage (dressing) will be placed over the catheter insertion area. The procedure may vary among health care providers and hospitals. What happens after the procedure?  Your blood pressure, heart rate, breathing rate, and blood oxygen level will be monitored until the medicines you were given have worn off.  Your catheter insertion area will be monitored for bleeding. You will need to lie still for a few hours to ensure that you do not bleed from the catheter insertion area.  Do not drive for 24 hours or as long as directed by your health care provider. Summary  Cardiac ablation is a procedure to disable (ablate) a small amount of heart tissue in very specific places. Ablating some of the problem areas can improve the heart rhythm or return it to normal.  During the procedure, electrical currents will be sent from the catheter to ablate heart tissue in desired areas. This information is not intended to replace advice given to you by your health care provider. Make sure you discuss any questions you have with your health care provider. Document Released: 07/27/2008 Document Revised: 01/28/2016 Document Reviewed: 01/28/2016 Elsevier Interactive Patient Education  [employer].

## 2017-06-22 ENCOUNTER — Other Ambulatory Visit: Payer: 59 | Admitting: *Deleted

## 2017-06-22 ENCOUNTER — Other Ambulatory Visit: Payer: Self-pay

## 2017-06-22 DIAGNOSIS — I4819 Other persistent atrial fibrillation: Secondary | ICD-10-CM

## 2017-06-22 LAB — BASIC METABOLIC PANEL
BUN/Creatinine Ratio: 15 (ref 9–20)
BUN: 13 mg/dL (ref 6–24)
CALCIUM: 9.4 mg/dL (ref 8.7–10.2)
CO2: 24 mmol/L (ref 20–29)
Chloride: 104 mmol/L (ref 96–106)
Creatinine, Ser: 0.87 mg/dL (ref 0.76–1.27)
GFR, EST AFRICAN AMERICAN: 115 mL/min/{1.73_m2} (ref >59)
GFR, EST NON AFRICAN AMERICAN: 99 mL/min/{1.73_m2} (ref >59)
Glucose: 98 mg/dL (ref 65–99)
POTASSIUM: 4.7 mmol/L (ref 3.5–5.2)
SODIUM: 140 mmol/L (ref 134–144)

## 2017-06-22 LAB — CBC WITH DIFFERENTIAL/PLATELET
BASOS: 1 %
Basophils Absolute: 0 10*3/uL (ref 0.0–0.2)
EOS (ABSOLUTE): 0.2 10*3/uL (ref 0.0–0.4)
EOS: 4 %
HEMATOCRIT: 42.9 % (ref 37.5–51.0)
Hemoglobin: 14.9 g/dL (ref 13.0–17.7)
Immature Grans (Abs): 0 10*3/uL (ref 0.0–0.1)
Immature Granulocytes: 0 %
LYMPHS ABS: 1.4 10*3/uL (ref 0.7–3.1)
Lymphs: 31 %
MCH: 31.4 pg (ref 26.6–33.0)
MCHC: 34.7 g/dL (ref 31.5–35.7)
MCV: 91 fL (ref 79–97)
MONOS ABS: 0.3 10*3/uL (ref 0.1–0.9)
Monocytes: 8 %
Neutrophils Absolute: 2.6 10*3/uL (ref 1.4–7.0)
Neutrophils: 56 %
Platelets: 178 10*3/uL (ref 150–379)
RBC: 4.74 x10E6/uL (ref 4.14–5.80)
RDW: 13.3 % (ref 12.3–15.4)
WBC: 4.5 10*3/uL (ref 3.4–10.8)

## 2017-06-30 ENCOUNTER — Other Ambulatory Visit (HOSPITAL_COMMUNITY): Payer: 59

## 2017-07-01 ENCOUNTER — Ambulatory Visit (HOSPITAL_COMMUNITY): Payer: 59 | Attending: Internal Medicine

## 2017-07-01 ENCOUNTER — Ambulatory Visit (HOSPITAL_COMMUNITY): Admission: RE | Admit: 2017-07-01 | Payer: 59 | Source: Ambulatory Visit

## 2017-07-01 ENCOUNTER — Other Ambulatory Visit: Payer: Self-pay

## 2017-07-01 ENCOUNTER — Ambulatory Visit (HOSPITAL_COMMUNITY): Payer: 59

## 2017-07-01 DIAGNOSIS — I517 Cardiomegaly: Secondary | ICD-10-CM | POA: Insufficient documentation

## 2017-07-01 DIAGNOSIS — I481 Persistent atrial fibrillation: Secondary | ICD-10-CM | POA: Diagnosis not present

## 2017-07-01 DIAGNOSIS — I480 Paroxysmal atrial fibrillation: Secondary | ICD-10-CM | POA: Diagnosis not present

## 2017-07-01 DIAGNOSIS — I4819 Other persistent atrial fibrillation: Secondary | ICD-10-CM

## 2017-07-02 ENCOUNTER — Ambulatory Visit (HOSPITAL_COMMUNITY)
Admission: RE | Admit: 2017-07-02 | Discharge: 2017-07-02 | Disposition: A | Discharge status: Home / Self Care | Payer: 59 | Source: Ambulatory Visit | Attending: Internal Medicine | Admitting: Internal Medicine

## 2017-07-02 ENCOUNTER — Ambulatory Visit (HOSPITAL_COMMUNITY): Payer: 59

## 2017-07-02 DIAGNOSIS — K449 Diaphragmatic hernia without obstruction or gangrene: Secondary | ICD-10-CM | POA: Diagnosis not present

## 2017-07-02 DIAGNOSIS — I4891 Unspecified atrial fibrillation: Secondary | ICD-10-CM | POA: Diagnosis not present

## 2017-07-02 DIAGNOSIS — I481 Persistent atrial fibrillation: Secondary | ICD-10-CM | POA: Diagnosis present

## 2017-07-02 DIAGNOSIS — I4819 Other persistent atrial fibrillation: Secondary | ICD-10-CM

## 2017-07-02 MED ORDER — IOPAMIDOL (ISOVUE-370) INJECTION 76%
100.0000 mL | Freq: Once | INTRAVENOUS | Status: AC | PRN
  Administered 2017-07-02: 80 mL via INTRAVENOUS

## 2017-07-02 MED ORDER — IOPAMIDOL (ISOVUE-370) INJECTION 76%
INTRAVENOUS | Status: AC
  Filled 2017-07-02: qty 100

## 2017-07-03 ENCOUNTER — Ambulatory Visit (HOSPITAL_COMMUNITY): Payer: 59 | Admitting: Certified Registered Nurse Anesthetist

## 2017-07-03 ENCOUNTER — Other Ambulatory Visit: Payer: Self-pay

## 2017-07-03 ENCOUNTER — Encounter (HOSPITAL_COMMUNITY): Payer: Self-pay | Admitting: Certified Registered Nurse Anesthetist

## 2017-07-03 ENCOUNTER — Ambulatory Visit (HOSPITAL_COMMUNITY)
Admission: RE | Admit: 2017-07-03 | Discharge: 2017-07-03 | Disposition: A | Discharge status: Home / Self Care | Payer: 59 | Source: Ambulatory Visit | Attending: Internal Medicine | Admitting: Internal Medicine

## 2017-07-03 ENCOUNTER — Ambulatory Visit (HOSPITAL_COMMUNITY)
Admission: RE | Disposition: A | Discharge status: Home / Self Care | Payer: Self-pay | Source: Ambulatory Visit | Attending: Internal Medicine

## 2017-07-03 DIAGNOSIS — I481 Persistent atrial fibrillation: Secondary | ICD-10-CM | POA: Insufficient documentation

## 2017-07-03 DIAGNOSIS — Z7951 Long term (current) use of inhaled steroids: Secondary | ICD-10-CM | POA: Diagnosis not present

## 2017-07-03 HISTORY — PX: ATRIAL FIBRILLATION ABLATION: EP1191

## 2017-07-03 LAB — POCT ACTIVATED CLOTTING TIME
ACTIVATED CLOTTING TIME: 324 s
Activated Clotting Time: 235 seconds
Activated Clotting Time: 279 seconds
Activated Clotting Time: 197 seconds

## 2017-07-03 SURGERY — ATRIAL FIBRILLATION ABLATION
Anesthesia: General

## 2017-07-03 MED ORDER — PROTAMINE SULFATE 10 MG/ML IV SOLN
INTRAVENOUS | Status: DC | PRN
  Administered 2017-07-03: 10 mg via INTRAVENOUS
  Administered 2017-07-03: 20 mg via INTRAVENOUS

## 2017-07-03 MED ORDER — ACETAMINOPHEN 325 MG PO TABS
650.0000 mg | ORAL_TABLET | ORAL | Status: DC | PRN
  Administered 2017-07-03: 650 mg via ORAL

## 2017-07-03 MED ORDER — MIDAZOLAM HCL 2 MG/2ML IJ SOLN
INTRAMUSCULAR | Status: DC | PRN
  Administered 2017-07-03: 2 mg via INTRAVENOUS

## 2017-07-03 MED ORDER — RIVAROXABAN 20 MG PO TABS: 20.0000 mg | ORAL_TABLET | Freq: Every day | ORAL | Status: DC

## 2017-07-03 MED ORDER — SODIUM CHLORIDE 0.9% FLUSH: 3.0000 mL | INTRAVENOUS | Status: DC | PRN

## 2017-07-03 MED ORDER — HEPARIN SODIUM (PORCINE) 1000 UNIT/ML IJ SOLN
INTRAMUSCULAR | Status: DC | PRN
  Administered 2017-07-03: 1000 [IU] via INTRAVENOUS

## 2017-07-03 MED ORDER — PHENYLEPHRINE 40 MCG/ML (10ML) SYRINGE FOR IV PUSH (FOR BLOOD PRESSURE SUPPORT)
PREFILLED_SYRINGE | INTRAVENOUS | Status: DC | PRN
  Administered 2017-07-03 (×6): 80 ug via INTRAVENOUS

## 2017-07-03 MED ORDER — BUPIVACAINE HCL (PF) 0.25 % IJ SOLN
INTRAMUSCULAR | Status: DC | PRN
  Administered 2017-07-03: 20 mL

## 2017-07-03 MED ORDER — FENTANYL CITRATE (PF) 100 MCG/2ML IJ SOLN
INTRAMUSCULAR | Status: DC | PRN
  Administered 2017-07-03 (×2): 50 ug via INTRAVENOUS

## 2017-07-03 MED ORDER — HEPARIN SODIUM (PORCINE) 1000 UNIT/ML IJ SOLN
INTRAMUSCULAR | Status: DC | PRN
  Administered 2017-07-03 (×2): 5000 [IU] via INTRAVENOUS
  Administered 2017-07-03: 3000 [IU] via INTRAVENOUS

## 2017-07-03 MED ORDER — ADENOSINE 6 MG/2ML IV SOLN
INTRAVENOUS | Status: DC | PRN
  Administered 2017-07-03 (×2): 12 mg via INTRAVENOUS

## 2017-07-03 MED ORDER — ACETAMINOPHEN 325 MG PO TABS
ORAL_TABLET | ORAL | Status: AC
  Filled 2017-07-03: qty 2

## 2017-07-03 MED ORDER — IOPAMIDOL (ISOVUE-370) INJECTION 76%
INTRAVENOUS | Status: AC
  Filled 2017-07-03: qty 50

## 2017-07-03 MED ORDER — ONDANSETRON HCL 4 MG/2ML IJ SOLN: 4.0000 mg | Freq: Four times a day (QID) | INTRAMUSCULAR | Status: DC | PRN

## 2017-07-03 MED ORDER — ONDANSETRON HCL 4 MG/2ML IJ SOLN
INTRAMUSCULAR | Status: DC | PRN
  Administered 2017-07-03: 4 mg via INTRAVENOUS

## 2017-07-03 MED ORDER — PROPOFOL 10 MG/ML IV BOLUS
INTRAVENOUS | Status: DC | PRN
  Administered 2017-07-03: 200 mg via INTRAVENOUS

## 2017-07-03 MED ORDER — SODIUM CHLORIDE 0.9 % IV SOLN
INTRAVENOUS | Status: DC
  Administered 2017-07-03: 06:00:00 via INTRAVENOUS

## 2017-07-03 MED ORDER — PANTOPRAZOLE SODIUM 40 MG PO TBEC: 40.0000 mg | DELAYED_RELEASE_TABLET | Freq: Every day | ORAL | 0 refills | Status: DC

## 2017-07-03 MED ORDER — DEXAMETHASONE SODIUM PHOSPHATE 10 MG/ML IJ SOLN
INTRAMUSCULAR | Status: DC | PRN
  Administered 2017-07-03: 10 mg via INTRAVENOUS

## 2017-07-03 MED ORDER — SODIUM CHLORIDE 0.9% FLUSH: 3.0000 mL | Freq: Two times a day (BID) | INTRAVENOUS | Status: DC

## 2017-07-03 MED ORDER — HEPARIN (PORCINE) IN NACL 2-0.9 UNIT/ML-% IJ SOLN
INTRAMUSCULAR | Status: AC
  Filled 2017-07-03: qty 500

## 2017-07-03 MED ORDER — IOPAMIDOL (ISOVUE-370) INJECTION 76%
INTRAVENOUS | Status: DC | PRN
  Administered 2017-07-03: 3 mL via INTRAVENOUS

## 2017-07-03 MED ORDER — ROCURONIUM BROMIDE 50 MG/5ML IV SOSY
PREFILLED_SYRINGE | INTRAVENOUS | Status: DC | PRN
  Administered 2017-07-03: 50 mg via INTRAVENOUS

## 2017-07-03 MED ORDER — SUGAMMADEX SODIUM 200 MG/2ML IV SOLN
INTRAVENOUS | Status: DC | PRN
  Administered 2017-07-03: 200 mg via INTRAVENOUS

## 2017-07-03 MED ORDER — LIDOCAINE 2% (20 MG/ML) 5 ML SYRINGE
INTRAMUSCULAR | Status: DC | PRN
  Administered 2017-07-03: 40 mg via INTRAVENOUS

## 2017-07-03 MED ORDER — SODIUM CHLORIDE 0.9 % IV SOLN: 250.0000 mL | INTRAVENOUS | Status: DC | PRN

## 2017-07-03 MED ORDER — BUPIVACAINE HCL (PF) 0.25 % IJ SOLN
INTRAMUSCULAR | Status: AC
  Filled 2017-07-03: qty 30

## 2017-07-03 MED ORDER — HEPARIN SODIUM (PORCINE) 1000 UNIT/ML IJ SOLN
INTRAMUSCULAR | Status: AC
  Filled 2017-07-03: qty 1

## 2017-07-03 MED ORDER — PHENYLEPHRINE HCL 10 MG/ML IJ SOLN
INTRAVENOUS | Status: DC | PRN
  Administered 2017-07-03: 25 ug/min via INTRAVENOUS

## 2017-07-03 MED ORDER — ADENOSINE 6 MG/2ML IV SOLN
INTRAVENOUS | Status: AC
  Filled 2017-07-03: qty 8

## 2017-07-03 MED ORDER — HYDROCODONE-ACETAMINOPHEN 5-325 MG PO TABS: 1.0000 | ORAL_TABLET | ORAL | Status: DC | PRN

## 2017-07-03 SURGICAL SUPPLY — 17 items
BLANKET WARM UNDERBOD FULL ACC (MISCELLANEOUS) ×3 IMPLANT
CATH MAPPNG PENTARAY F 2-6-2MM (CATHETERS) ×1 IMPLANT
CATH NAVISTAR SMARTTOUCH DF (ABLATOR) ×3 IMPLANT
CATH SOUNDSTAR 3D IMAGING (CATHETERS) ×3 IMPLANT
CATH WEBSTER BI DIR CS D-F CRV (CATHETERS) ×3 IMPLANT
COVER SWIFTLINK CONNECTOR (BAG) ×3 IMPLANT
NEEDLE BAYLIS TRANSSEPTAL 71CM (NEEDLE) ×3 IMPLANT
PACK EP LATEX FREE (CUSTOM PROCEDURE TRAY) ×2
PACK EP LF (CUSTOM PROCEDURE TRAY) ×1 IMPLANT
PAD DEFIB LIFELINK (PAD) ×3 IMPLANT
PATCH CARTO3 (PAD) ×3 IMPLANT
PENTARAY F 2-6-2MM (CATHETERS) ×3
SHEATH AVANTI 11CM 7FR (SHEATH) ×6 IMPLANT
SHEATH AVANTI 11CM 9FR (SHEATH) ×3 IMPLANT
SHEATH AVANTI 11F 11CM (SHEATH) ×3 IMPLANT
SHEATH SWARTZ TS SL2 63CM 8.5F (SHEATH) ×3 IMPLANT
TUBING SMART ABLATE COOLFLOW (TUBING) ×3 IMPLANT

## 2017-07-03 NOTE — Discharge Instructions (Signed)
Post procedure care instructions No driving for 4 days. No lifting over 5 lbs for 1 week. No vigorous or sexual activity for 1 week. You may return to work on 07/10/17. Keep procedure site clean & dry. If you notice increased pain, swelling, bleeding or pus, call/return!  You may shower, but no soaking baths/hot tubs/pools for 1 week.    You have an appointment set up with the Atrial Fibrillation Clinic.  Multiple studies have shown that being followed by a dedicated atrial fibrillation clinic in addition to the standard care you receive from your other physicians improves health. We believe that enrollment in the atrial fibrillation clinic will allow [place] to better care for you.   The phone number to the Atrial Fibrillation Clinic is [phone]. The clinic is staffed Monday through Friday from 8:30am to 5pm.  Parking Directions: The clinic is located in the Heart and Vascular Building connected to [facility]. 1)From [address] turn on to [address] and go to the 3rd entrance  (Heart and Vascular entrance) on the right. 2)Look to the right for Heart &Vascular Parking Garage. 3)A code for the entrance is required please call the clinic to receive this.   4)Take the elevators to the 1st floor. Registration is in the room with the glass walls at the end of the hallway.  If you have any trouble parking or locating the clinic, please dont hesitate to call [phone].   Cardiac Ablation, Care After This sheet gives you information about how to care for yourself after your procedure. Your health care provider may also give you more specific instructions. If you have problems or questions, contact your health care provider. What can I expect after the procedure? After the procedure, it is common to have:  Bruising around your puncture site.  Tenderness around your puncture site.  Skipped heartbeats.  Tiredness (fatigue).  Follow these instructions at home: Puncture site  care  Follow instructions from your health care provider about how to take care of your puncture site. Make sure you: ? Wash your hands with soap and water before you change your bandage (dressing). If soap and water are not available, use hand sanitizer. ? Change your dressing as told by your health care provider. ? Leave stitches (sutures), skin glue, or adhesive strips in place. These skin closures may need to stay in place for up to 2 weeks. If adhesive strip edges start to loosen and curl up, you may trim the loose edges. Do not remove adhesive strips completely unless your health care provider tells you to do that.  Check your puncture site every day for signs of infection. Check for: ? Redness, swelling, or pain. ? Fluid or blood. If your puncture site starts to bleed, lie down on your back, apply firm pressure to the area, and contact your health care provider. ? Warmth. ? Pus or a bad smell. Driving  Ask your health care provider when it is safe for you to drive again after the procedure.  Do not drive or use heavy machinery while taking prescription pain medicine.  Do not drive for 24 hours if you were given a medicine to help you relax (sedative) during your procedure. Activity  Avoid activities that take a lot of effort for at least 3 days after your procedure.  Do not lift anything that is heavier than 10 lb (4.5 kg), or the limit that you are told, until your health care provider says that it is safe.  Return  to your normal activities as told by your health care provider. Ask your health care provider what activities are safe for you. General instructions  Take over-the-counter and prescription medicines only as told by your health care provider.  Do not use any products that contain nicotine or tobacco, such as cigarettes and e-cigarettes. If you need help quitting, ask your health care provider.  Do not take baths, swim, or use a hot tub until your health care provider  approves.  Do not drink alcohol for 24 hours after your procedure.  Keep all follow-up visits as told by your health care provider. This is important. Contact a health care provider if:  You have redness, mild swelling, or pain around your puncture site.  You have fluid or blood coming from your puncture site that stops after applying firm pressure to the area.  Your puncture site feels warm to the touch.  You have pus or a bad smell coming from your puncture site.  You have a fever.  You have chest pain or discomfort that spreads to your neck, jaw, or arm.  You are sweating a lot.  You feel nauseous.  You have a fast or irregular heartbeat.  You have shortness of breath.  You are dizzy or light-headed and feel the need to lie down.  You have pain or numbness in the arm or leg closest to your puncture site. Get help right away if:  Your puncture site suddenly swells.  Your puncture site is bleeding and the bleeding does not stop after applying firm pressure to the area. These symptoms may represent a serious problem that is an emergency. Do not wait to see if the symptoms will go away. Get medical help right away. Call your local emergency services (911 in the [place] Do not drive yourself to the hospital. Summary  After the procedure, it is normal to have bruising and tenderness at the puncture site in your groin, neck, or forearm.  Check your puncture site every day for signs of infection.  Get help right away if your puncture site is bleeding and the bleeding does not stop after applying firm pressure to the area. This is a medical emergency. This information is not intended to replace advice given to you by your health care provider. Make sure you discuss any questions you have with your health care provider. Document Released: 06/19/2016 Document Revised: 06/19/2016 Document Reviewed: 06/19/2016 Elsevier Interactive Patient Education  2018 [employer]

## 2017-07-03 NOTE — Anesthesia Postprocedure Evaluation (Signed)
Anesthesia Post Note  Patient: Shawn Hurley  Procedure(s) Performed: ATRIAL FIBRILLATION ABLATION (N/A )     Patient location during evaluation: Cath Lab Anesthesia Type: General Level of consciousness: awake and alert Pain management: pain level controlled Vital Signs Assessment: post-procedure vital signs reviewed and stable Respiratory status: spontaneous breathing, nonlabored ventilation, respiratory function stable and patient connected to nasal cannula oxygen Cardiovascular status: blood pressure returned to baseline and stable Postop Assessment: no apparent nausea or vomiting Anesthetic complications: no    Last Vitals:  Vitals:   07/03/17 1500 07/03/17 1600  BP: 133/78 122/71  Pulse: 85 82  Resp: 18 14  Temp:    SpO2: 96% 94%    Last Pain:  Vitals:   07/03/17 1330  TempSrc:   PainSc: 1                  Kais Monje COKER

## 2017-07-03 NOTE — Anesthesia Procedure Notes (Signed)
Procedure Name: Intubation Date/Time: 07/03/2017 8:00 AM Performed by: Pearson Grippeobertson, Brucha Ahlquist M, CRNA Pre-anesthesia Checklist: Patient identified, Emergency Drugs available, Suction available and Patient being monitored Patient Re-evaluated:Patient Re-evaluated prior to induction Oxygen Delivery Method: Circle system utilized Preoxygenation: Pre-oxygenation with 100% oxygen Induction Type: IV induction Ventilation: Mask ventilation without difficulty Laryngoscope Size: Miller and 2 Grade View: Grade I Tube type: Oral Tube size: 7.5 mm Number of attempts: 1 Airway Equipment and Method: Stylet and Oral airway Placement Confirmation: ETT inserted through vocal cords under direct vision,  positive ETCO2 and breath sounds checked- equal and bilateral Secured at: 23 cm Tube secured with: Tape Dental Injury: Teeth and Oropharynx as per pre-operative assessment

## 2017-07-03 NOTE — Anesthesia Preprocedure Evaluation (Signed)
Anesthesia Evaluation  Patient identified by MRN, date of birth, ID band Patient awake    Reviewed: Allergy & Precautions, NPO status , Patient's Chart, lab work & pertinent test results  Airway Mallampati: II  TM Distance: >3 FB Neck ROM: Full    Dental  (+) Teeth Intact, Dental Advisory Given   Pulmonary    breath sounds clear to auscultation       Cardiovascular  Rhythm:Regular Rate:Normal     Neuro/Psych    GI/Hepatic   Endo/Other    Renal/GU      Musculoskeletal   Abdominal   Peds  Hematology   Anesthesia Other Findings   Reproductive/Obstetrics                             Anesthesia Physical Anesthesia Plan  ASA: III  Anesthesia Plan: General   Post-op Pain Management:    Induction: Intravenous  PONV Risk Score and Plan: Ondansetron and Dexamethasone  Airway Management Planned: LMA  Additional Equipment:   Intra-op Plan:   Post-operative Plan:   Informed Consent: I have reviewed the patients History and Physical, chart, labs and discussed the procedure including the risks, benefits and alternatives for the proposed anesthesia with the patient or authorized representative who has indicated his/her understanding and acceptance.     Dental advisory given  Plan Discussed with: CRNA and Anesthesiologist  Anesthesia Plan Comments:         Anesthesia Quick Evaluation  

## 2017-07-03 NOTE — Interval H&P Note (Signed)
History and Physical Interval Note:  07/03/2017 7:19 AM  Shawn Hurley  has presented today for surgery, with the diagnosis of afib  The various methods of treatment have been discussed with the patient and family. After consideration of risks, benefits and other options for treatment, the patient has consented to  Procedure(s): ATRIAL FIBRILLATION ABLATION (N/A) as a surgical intervention .  The patient's history has been reviewed, patient examined, no change in status, stable for surgery.  I have reviewed the patient's chart and labs.  Questions were answered to the patient's satisfaction.    Reports compliance with xarelto without interruption. Cardiac CT and labs reviewed with patient. [name]

## 2017-07-03 NOTE — Progress Notes (Addendum)
Site area: rt groin fv sheaths x3 Site Prior to Removal:  Level 0 Pressure Applied For: 30 minutes Manual:   yes Patient Status During Pull:  stable Post Pull Site:  Level 0 Post Pull Instructions Given:  yes Post Pull Pulses Present: palpable rt dp Dressing Applied:  Gauze and tegaderm Bedrest begins @ 1220 Comments:  IV saline locked

## 2017-07-03 NOTE — Transfer of Care (Signed)
Immediate Anesthesia Transfer of Care Note  Patient: Shawn Hurley  Procedure(s) Performed: ATRIAL FIBRILLATION ABLATION (N/A )  Patient Location: Cath Lab  Anesthesia Type:General  Level of Consciousness: awake, alert  and oriented  Airway & Oxygen Therapy: Patient Spontanous Breathing and Patient connected to nasal cannula oxygen  Post-op Assessment: Report given to RN and Post -op Vital signs reviewed and stable  Post vital signs: Reviewed and stable  Last Vitals:  Vitals Value Taken Time  BP 113/74 07/03/2017 10:31 AM  Temp 36.6 C 07/03/2017 10:24 AM  Pulse 72 07/03/2017 10:33 AM  Resp 17 07/03/2017 10:33 AM  SpO2 99 % 07/03/2017 10:33 AM  Vitals shown include unvalidated device data.  Last Pain:  Vitals:   07/03/17 0607  TempSrc:   PainSc: 0-No pain         Complications: No apparent anesthesia complications

## 2017-07-06 ENCOUNTER — Encounter (HOSPITAL_COMMUNITY): Payer: Self-pay | Admitting: Internal Medicine

## 2017-07-07 MED FILL — Heparin Sodium (Porcine) 2 Unit/ML in Sodium Chloride 0.9%: INTRAMUSCULAR | Qty: 500 | Status: AC

## 2017-07-23 ENCOUNTER — Telehealth: Payer: Self-pay | Admitting: Family Medicine

## 2017-07-23 ENCOUNTER — Other Ambulatory Visit: Payer: Self-pay

## 2017-07-23 MED ORDER — MOMETASONE FUROATE 50 MCG/ACT NA SUSP: 2.0000 | Freq: Every day | NASAL | 3 refills | Status: DC

## 2017-07-23 NOTE — Telephone Encounter (Signed)
Please call patient once rx has been sent

## 2017-07-23 NOTE — Telephone Encounter (Signed)
Rx has been sent to [facility] pt advised and voiced understanding.

## 2017-07-23 NOTE — Telephone Encounter (Signed)
Copied from CRM [id number]. Topic: Quick Communication - Rx Refill/Question >> Jul 23, 2017  9:40 AM Leafy Ro wrote: Medication: nasonex nasal sprayHas the patient contacted their pharmacy? Yes [facility] delivery: pt needs #3 for 90 day supply. Flonase is not longer covered under insurance

## 2017-08-03 ENCOUNTER — Ambulatory Visit (HOSPITAL_COMMUNITY)
Admission: RE | Admit: 2017-08-03 | Discharge: 2017-08-03 | Disposition: A | Discharge status: Home / Self Care | Payer: 59 | Source: Ambulatory Visit | Attending: Nurse Practitioner | Admitting: Nurse Practitioner

## 2017-08-03 ENCOUNTER — Encounter (HOSPITAL_COMMUNITY): Payer: Self-pay | Admitting: Nurse Practitioner

## 2017-08-03 VITALS — BP 128/84 | HR 82 | Ht 73.0 in | Wt 242.6 lb

## 2017-08-03 DIAGNOSIS — Z79899 Other long term (current) drug therapy: Secondary | ICD-10-CM | POA: Diagnosis not present

## 2017-08-03 DIAGNOSIS — I480 Paroxysmal atrial fibrillation: Secondary | ICD-10-CM | POA: Diagnosis not present

## 2017-08-03 DIAGNOSIS — I4891 Unspecified atrial fibrillation: Secondary | ICD-10-CM | POA: Diagnosis present

## 2017-08-03 DIAGNOSIS — I481 Persistent atrial fibrillation: Secondary | ICD-10-CM | POA: Diagnosis not present

## 2017-08-03 DIAGNOSIS — Z823 Family history of stroke: Secondary | ICD-10-CM | POA: Diagnosis not present

## 2017-08-03 DIAGNOSIS — Z7901 Long term (current) use of anticoagulants: Secondary | ICD-10-CM | POA: Insufficient documentation

## 2017-08-03 NOTE — Progress Notes (Signed)
Patient ID: Shawn Hurley, male   DOB: April 25, 1965, 52 y.o.   MRN: [id number]     Primary Care Physician: Nelwyn Salisbury, MD Referring Physician: Dr. Hendricks Milo Gaster is a 52 y.o. male with a h/o ablation 08/30/13 and f/u recent repeat afib ablation 07/03/17, he has not noted any afib since the procedure. No swallowing or groin issues. He feels well. He is monitoring his afib with apple 4 watch.   Today, he denies symptoms of palpitations, chest pain, shortness of breath, orthopnea, PND, lower extremity edema, dizziness, presyncope, syncope, or neurologic sequela. Positive for mild fatigue. The patient is tolerating medications without difficulties and is otherwise without complaint today.   Past Medical History:  Diagnosis Date  . Allergy   . Atrial flutter (HCC)    typical appearing  . Ejection fraction    EF 55-60%, echo, May, 2013  . High risk medication use    Flecainide  . Persistent atrial fibrillation (HCC)    a. atrial fibrillation, April, 2013, b. 07/2011 Echo: EF 55-60%   Past Surgical History:  Procedure Laterality Date  . ABLATION  08-30-13   PVI and CTI ablation by Dr Johney Frame  . ATRIAL FIBRILLATION ABLATION N/A 08/30/2013   Procedure: ATRIAL FIBRILLATION ABLATION;  Surgeon: Gardiner Rhyme, MD;  Location: [facility] CATH LAB;  Service: Cardiovascular;  Laterality: N/A;  . ATRIAL FIBRILLATION ABLATION N/A 07/03/2017   Procedure: ATRIAL FIBRILLATION ABLATION;  Surgeon: Hillis Range, MD;  Location: [facility] INVASIVE CV LAB;  Service: Cardiovascular;  Laterality: N/A;  . HERNIA REPAIR  2000   umbilical hernia  . TEE WITHOUT CARDIOVERSION N/A 08/29/2013   Procedure: TRANSESOPHAGEAL ECHOCARDIOGRAM (TEE);  Surgeon: Lars Masson, MD;  Location: [facility] ENDOSCOPY;  Service: Cardiovascular;  Laterality: N/A;  . TONSILLECTOMY AND ADENOIDECTOMY    . WRIST SURGERY  1979   left wrist, to drain an infection     Current Outpatient Medications  Medication Sig Dispense Refill  . Ascorbic Acid (VITAMIN  C) 1000 MG tablet Take 2,000 mg by mouth daily.     . fish oil-omega-3 fatty acids 1000 MG capsule Take 1 g by mouth daily.    Marland Kitchen ibuprofen (ADVIL) 200 MG tablet Take 400 mg by mouth every 6 (six) hours as needed (for back pain).     Marland Kitchen loratadine (CLARITIN) 10 MG tablet Take 1 tablet (10 mg total) by mouth daily. 100 tablet 3  . mometasone (NASONEX) 50 MCG/ACT nasal spray Place 2 sprays into the nose daily. 51 g 3  . Multiple Vitamin (MULTIVITAMIN) capsule Take 1 capsule by mouth daily.    . pantoprazole (PROTONIX) 40 MG tablet Take 1 tablet (40 mg total) by mouth daily. 45 tablet 0  . rivaroxaban (XARELTO) 20 MG TABS tablet Take 1 tablet (20 mg total) by mouth daily with supper. 30 tablet 11  . vitamin E 400 UNIT capsule Take 400 Units by mouth daily.    Marland Kitchen diltiazem (CARDIZEM) 30 MG tablet Take 1-2 tablets by mouth as needed every 6 hours for heart racing. (Patient not taking: Reported on 08/03/2017) 30 tablet 3   No current facility-administered medications for this encounter.     No Known Allergies  Social History   Socioeconomic History  . Marital status: Married    Spouse name: Not on file  . Number of children: Not on file  . Years of education: Not on file  . Highest education level: Not on file  Occupational History  . Not on file  Social Needs  . Financial resource strain: Not on file  . Food insecurity:    Worry: Not on file    Inability: Not on file  . Transportation needs:    Medical: Not on file    Non-medical: Not on file  Tobacco Use  . Smoking status: Never Smoker  . Smokeless tobacco: Never Used  Substance and Sexual Activity  . Alcohol use: Yes    Alcohol/week: 4.2 oz    Types: 7 Standard drinks or equivalent per week    Comment: 1-2 drinks per day  . Drug use: No  . Sexual activity: Yes  Lifestyle  . Physical activity:    Days per week: Not on file    Minutes per session: Not on file  . Stress: Not on file  Relationships  . Social connections:     Talks on phone: Not on file    Gets together: Not on file    Attends religious service: Not on file    Active member of club or organization: Not on file    Attends meetings of clubs or organizations: Not on file    Relationship status: Not on file  . Intimate partner violence:    Fear of current or ex partner: Not on file    Emotionally abused: Not on file    Physically abused: Not on file    Forced sexual activity: Not on file  Other Topics Concern  . Not on file  Social History Narrative   Lives in [place]. Works as Water engineer at the [employer]    Family History  Problem Relation Age of Onset  . Stroke Mother   . Heart attack Maternal Grandmother   . Heart attack Maternal Grandfather     ROS- All systems are reviewed and negative except as per the HPI above  Physical Exam: Vitals:   08/03/17 1028  BP: 128/84  Pulse: 82  Weight: 242 lb 9.6 oz (110 kg)  Height:  (1.854 m)    GEN- The patient is well appearing, alert and oriented x 3 today.   Head- normocephalic, atraumatic Eyes-  Sclera clear, conjunctiva pink Ears- hearing intact Oropharynx- clear Neck- supple, no JVP Lymph- no cervical lymphadenopathy Lungs- Clear to ausculation bilaterally, normal work of breathing Heart- Irregular rate and rhythm, no murmurs, rubs or gallops, PMI not laterally displaced GI- soft, NT, ND, + BS Extremities- no clubbing, cyanosis, or edema MS- no significant deformity or atrophy Skin- no rash or lesion Psych- euthymic mood, full affect Neuro- strength and sensation are intact  EKG- Afib at 102 bpm. QRS 94 ms, QTc 461 ms. Epic records reviewed. Labs 07/31/14 138 142R 138       Potassium 3.5 - 5.1 mEq/L 4.5 4.2R 4.0    Chloride 96 - 112 mEq/L 104 102 106    CO2 19 - 32 mEq/L Glucose, Bld 70 - 99 mg/dL 92 161 (H) 97    BUN 6 - 23 mg/dL Creatinine, Ser 0.40 - 1.50 mg/dL 0.96 0.45W 0.9W    Calcium 8.4 - 10.5 mg/dL 9.7 8.8 9.6     GFR >11.91 mL/min 108.86             Assessment and Plan: 1. Persistent afib Continue Xarelto 20 mg q day at dinner time, chadsvasc score is 0, will need to continue for at least the 3 months following abaltion Has 30 mg Cardizem if needed ,  but no afib detected since the ablation  F/u with Dr. Johney Frame 7/29 afib clinic as needed    Lupita Leash [facility] [address] [place], [place] [address] [phone]

## 2017-10-19 ENCOUNTER — Ambulatory Visit (INDEPENDENT_AMBULATORY_CARE_PROVIDER_SITE_OTHER): Payer: 59 | Admitting: Internal Medicine

## 2017-10-19 ENCOUNTER — Encounter: Payer: Self-pay | Admitting: Internal Medicine

## 2017-10-19 ENCOUNTER — Encounter (INDEPENDENT_AMBULATORY_CARE_PROVIDER_SITE_OTHER): Payer: Self-pay

## 2017-10-19 VITALS — BP 126/80 | HR 83 | Ht 73.0 in | Wt 238.0 lb

## 2017-10-19 DIAGNOSIS — I4819 Other persistent atrial fibrillation: Secondary | ICD-10-CM

## 2017-10-19 DIAGNOSIS — I481 Persistent atrial fibrillation: Secondary | ICD-10-CM

## 2017-10-19 NOTE — Progress Notes (Signed)
   PCP: Nelwyn SalisburyFry, Stephen A, MD    Shawn Hurley is a 52 y.o. male who presents today for routine electrophysiology followup.  Since his recent afib ablation, the patient reports doing very well.  he denies procedure related complications and is pleased with the results of the procedure.  Today, he denies symptoms of palpitations, chest pain, shortness of breath,  lower extremity edema, dizziness, presyncope, or syncope.  The patient is otherwise without complaint today.   Past Medical History:  Diagnosis Date  . Allergy   . Atrial flutter (HCC)    typical appearing  . Ejection fraction    EF 55-60%, echo, May, 2013  . High risk medication use    Flecainide  . Persistent atrial fibrillation (HCC)    a. atrial fibrillation, April, 2013, b. 07/2011 Echo: EF 55-60%   Past Surgical History:  Procedure Laterality Date  . ABLATION  08-30-13   PVI and CTI ablation by Dr Johney FrameAllred  . ATRIAL FIBRILLATION ABLATION N/A 08/30/2013   Procedure: ATRIAL FIBRILLATION ABLATION;  Surgeon: Gardiner RhymeJames D Tacuma Graffam, MD;  Location: [facility] CATH LAB;  Service: Cardiovascular;  Laterality: N/A;  . ATRIAL FIBRILLATION ABLATION N/A 07/03/2017   Procedure: ATRIAL FIBRILLATION ABLATION;  Surgeon: Hillis RangeAllred, Shacora Zynda, MD;  Location: [facility] INVASIVE CV LAB;  Service: Cardiovascular;  Laterality: N/A;  . HERNIA REPAIR  2000   umbilical hernia  . TEE WITHOUT CARDIOVERSION N/A 08/29/2013   Procedure: TRANSESOPHAGEAL ECHOCARDIOGRAM (TEE);  Surgeon: Lars MassonKatarina H Nelson, MD;  [facility];  Service: Cardiovascular;  Laterality: N/A;  . TONSILLECTOMY AND ADENOIDECTOMY    . WRIST SURGERY  1979   left wrist, to drain an infection     ROS- all systems are personally reviewed and negatives except as per HPI above  Current Outpatient Medications  Medication Sig Dispense Refill  . Ascorbic Acid (VITAMIN C) 1000 MG tablet Take 2,000 mg by mouth daily.     . fish oil-omega-3 fatty acids 1000 MG capsule Take 1 g by mouth daily.    Marland Kitchen. ibuprofen (ADVIL)  200 MG tablet Take 400 mg by mouth every 6 (six) hours as needed (for back pain).     Marland Kitchen. loratadine (CLARITIN) 10 MG tablet Take 1 tablet (10 mg total) by mouth daily. 100 tablet 3  . mometasone (NASONEX) 50 MCG/ACT nasal spray Place 2 sprays into the nose daily. 51 g 3  . Multiple Vitamin (MULTIVITAMIN) capsule Take 1 capsule by mouth daily.    . vitamin E 400 UNIT capsule Take 400 Units by mouth daily.     No current facility-administered medications for this visit.     Physical Exam: Vitals:   10/19/17 1123  BP: 126/80  Pulse: 83  Weight: 238 lb (108 kg)  Height: 6\' 1"  (1.854 m)    GEN- The patient is well appearing, alert and oriented x 3 today.   Head- normocephalic, atraumatic Eyes-  Sclera clear, conjunctiva pink Ears- hearing intact Oropharynx- clear Lungs- Clear to ausculation bilaterally, normal work of breathing Heart- Regular rate and rhythm, no murmurs, rubs or gallops, PMI not laterally displaced GI- soft, NT, ND, + BS Extremities- no clubbing, cyanosis, or edema  EKG tracing ordered today is personally reviewed and shows sinus rhythm  Assessment and Plan:  1. Persistent  atrial fibrillation Doing well s/p ablation off AADs chads2vasc score is 0 Stop anticoagulation  Return to see me in 6 months  Hillis RangeJames Tobi Groesbeck MD, [facility] 10/19/2017 12:26 PM

## 2017-10-19 NOTE — Patient Instructions (Addendum)
Medication Instructions:  Your physician has recommended you make the following change in your medication:   1.  Stop taking Xarelto   Labwork: None ordered.  Testing/Procedures: None ordered.  Follow-Up: Your physician wants you to follow-up in: 6 months with Dr. Johney FrameAllred.      Any Other Special Instructions Will Be Listed Below (If Applicable).  If you need a refill on your cardiac medications before your next appointment, please call your pharmacy.

## 2018-04-26 ENCOUNTER — Encounter: Payer: Self-pay | Admitting: Internal Medicine

## 2018-04-26 ENCOUNTER — Ambulatory Visit: Payer: 59 | Admitting: Internal Medicine

## 2018-04-26 VITALS — BP 126/80 | HR 72 | Ht 73.0 in | Wt 241.0 lb

## 2018-04-26 DIAGNOSIS — I4819 Other persistent atrial fibrillation: Secondary | ICD-10-CM | POA: Diagnosis not present

## 2018-04-26 NOTE — Progress Notes (Signed)
PCP: Nelwyn Salisbury, MD   Primary EP: Dr Johney Frame  Shawn Hurley is a 53 y.o. male who presents today for routine electrophysiology followup.  Since last being seen in our clinic, the patient reports doing very well.  Today, he denies symptoms of palpitations, chest pain, shortness of breath,  lower extremity edema, dizziness, presyncope, or syncope.  The patient is otherwise without complaint today.   Past Medical History:  Diagnosis Date  . Allergy   . Atrial flutter (HCC)    typical appearing  . Ejection fraction    EF 55-60%, echo, May, 2013  . High risk medication use    Flecainide  . Persistent atrial fibrillation    a. atrial fibrillation, April, 2013, b. 07/2011 Echo: EF 55-60%   Past Surgical History:  Procedure Laterality Date  . ABLATION  08-30-13   PVI and CTI ablation by Dr Johney Frame  . ATRIAL FIBRILLATION ABLATION N/A 08/30/2013   Procedure: ATRIAL FIBRILLATION ABLATION;  Surgeon: Gardiner Rhyme, MD;  Location: [facility] CATH LAB;  Service: Cardiovascular;  Laterality: N/A;  . ATRIAL FIBRILLATION ABLATION N/A 07/03/2017   Procedure: ATRIAL FIBRILLATION ABLATION;  Surgeon: Hillis Range, MD;  Location: [facility] INVASIVE CV LAB;  Service: Cardiovascular;  Laterality: N/A;  . HERNIA REPAIR  2000   umbilical hernia  . TEE WITHOUT CARDIOVERSION N/A 08/29/2013   Procedure: TRANSESOPHAGEAL ECHOCARDIOGRAM (TEE);  Surgeon: Lars Masson, MD;  Location: [facility] ENDOSCOPY;  Service: Cardiovascular;  Laterality: N/A;  . TONSILLECTOMY AND ADENOIDECTOMY    . WRIST SURGERY  1979   left wrist, to drain an infection     ROS- all systems are reviewed and negatives except as per HPI above  Current Outpatient Medications  Medication Sig Dispense Refill  . Ascorbic Acid (VITAMIN C) 1000 MG tablet Take 2,000 mg by mouth daily.     . fish oil-omega-3 fatty acids 1000 MG capsule Take 1 g by mouth daily.    Marland Kitchen ibuprofen (ADVIL) 200 MG tablet Take 400 mg by mouth every 6 (six) hours as needed (for back pain).      Marland Kitchen loratadine (CLARITIN) 10 MG tablet Take 1 tablet (10 mg total) by mouth daily. 100 tablet 3  . mometasone (NASONEX) 50 MCG/ACT nasal spray Place 2 sprays into the nose daily. 51 g 3  . Multiple Vitamin (MULTIVITAMIN) capsule Take 1 capsule by mouth daily.    . vitamin E 400 UNIT capsule Take 400 Units by mouth daily.     No current facility-administered medications for this visit.     Physical Exam: Vitals:   04/26/18 1013  BP: 126/80  Pulse: 72  SpO2: 96%  Weight: 241 lb (109.3 kg)  Height: 6\' 1"  (1.854 m)    GEN- The patient is well appearing, alert and oriented x 3 today.   Head- normocephalic, atraumatic Eyes-  Sclera clear, conjunctiva pink Ears- hearing intact Oropharynx- clear Lungs- Clear to ausculation bilaterally, normal work of breathing Heart- Regular rate and rhythm, no murmurs, rubs or gallops, PMI not laterally displaced GI- soft, NT, ND, + BS Extremities- no clubbing, cyanosis, or edema  Wt Readings from Last 3 Encounters:  04/26/18 241 lb (109.3 kg)  10/19/17 238 lb (108 kg)  08/03/17 242 lb 9.6 oz (110 kg)    EKG tracing ordered today is personally reviewed and shows sinus rhythm  Assessment and Plan:  1. Persistent afib Doing great post ablation off AAD therapy chads2vasc score is 0. No changes  Return in a year  Hillis Range MD, [facility] 04/26/2018 10:29 AM

## 2018-04-26 NOTE — Patient Instructions (Signed)

## 2018-11-06 ENCOUNTER — Other Ambulatory Visit: Payer: Self-pay

## 2018-11-06 DIAGNOSIS — Z20822 Contact with and (suspected) exposure to covid-19: Secondary | ICD-10-CM

## 2018-11-07 LAB — NOVEL CORONAVIRUS, NAA: SARS-CoV-2, NAA: NOT DETECTED

## 2018-11-09 ENCOUNTER — Telehealth: Payer: Self-pay | Admitting: General Practice

## 2018-11-09 NOTE — Telephone Encounter (Signed)
Patient informed of negative covid-19 result. Patient verbalized understanding.  °

## 2018-12-23 ENCOUNTER — Other Ambulatory Visit: Payer: Self-pay

## 2018-12-23 DIAGNOSIS — Z20822 Contact with and (suspected) exposure to covid-19: Secondary | ICD-10-CM

## 2018-12-24 ENCOUNTER — Ambulatory Visit: Payer: Self-pay

## 2018-12-24 LAB — NOVEL CORONAVIRUS, NAA: SARS-CoV-2, NAA: NOT DETECTED

## 2018-12-24 NOTE — Telephone Encounter (Signed)
Provided Covid lab results  To wife who voiced understanding.

## 2018-12-28 IMAGING — CT CT HEART MORPH/PULM VEIN W/ CM & W/O CA SCORE
2 of 7 series · 9 of 20 positions shown, 11 images · IV contrast (Omni 300)
Comparison: None.

CLINICAL DATA: 51-year-old male with atrial fibrillation scheduled
for an ablation.

EXAM:
Cardiac CT/CTA
TECHNIQUE: The patient was scanned on a Siemens Somatom scanner.

[Series 7: pvm 0.5 i26f 3 60 - 80 % · axial · 0.41mm/px · z∈[+1173,+1208]mm · 2 of 1720 slices shown (1 of 2)]
[im 574/1720  vessel]
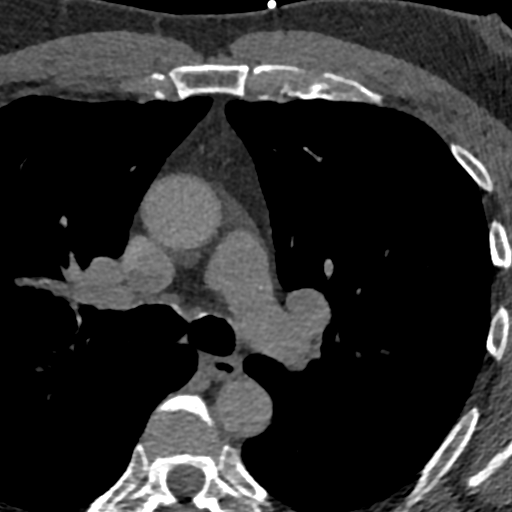
[im 1147/1720  vessel]
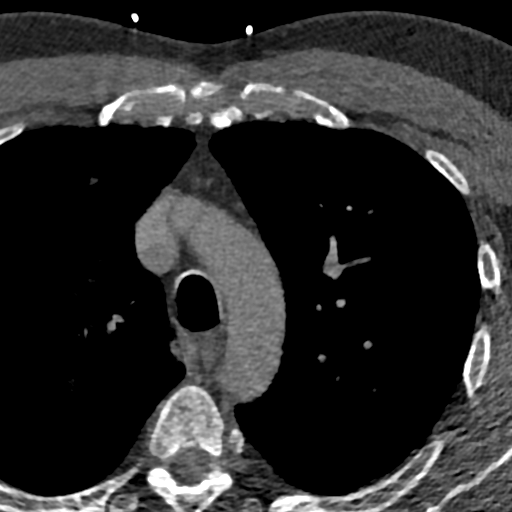

[Series 8: pvm 0.5 i26f 3 60 - 80 % · axial · 0.41mm/px · z∈[+1048,+1178]mm · 7 of 2870 slices shown, 9 images (2 of 2)]
[im 359/2870  vessel]
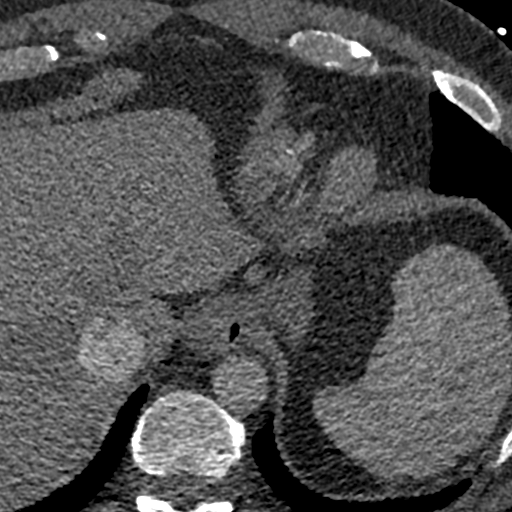
[im 359/2870  lung]
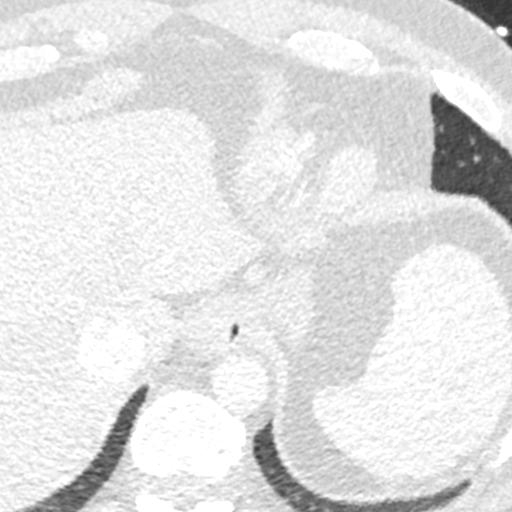
[im 718/2870  vessel]
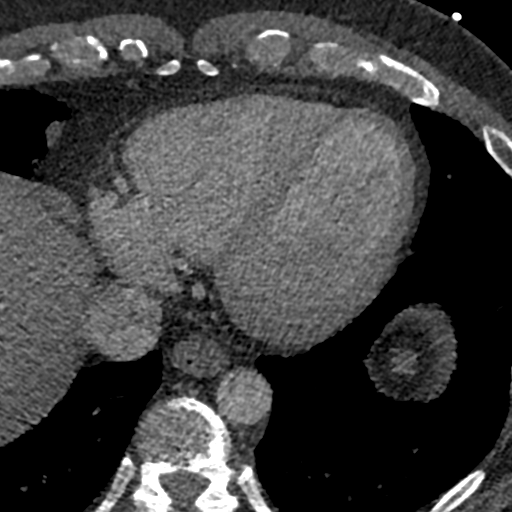
[im 1076/2870  vessel]
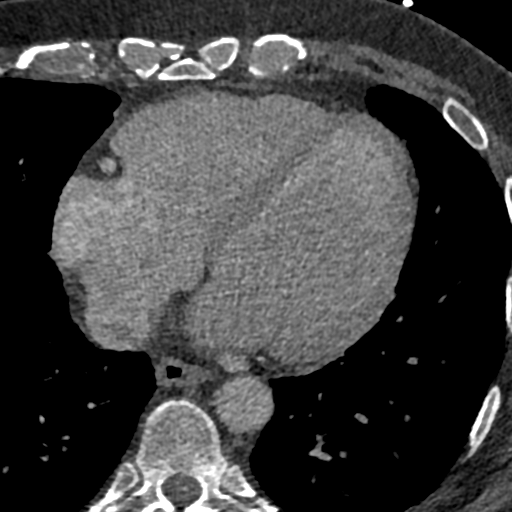
[im 1435/2870  vessel]
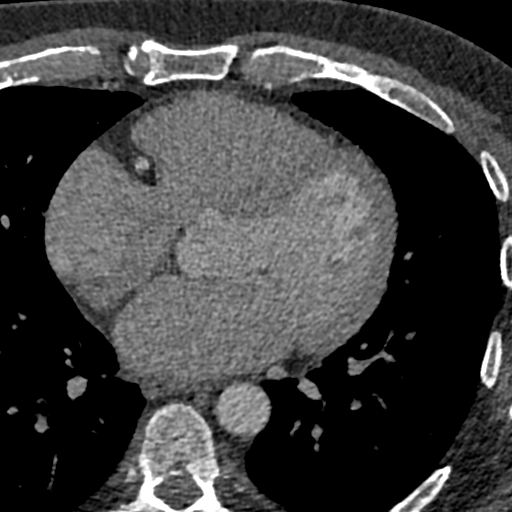
[im 1794/2870  vessel]
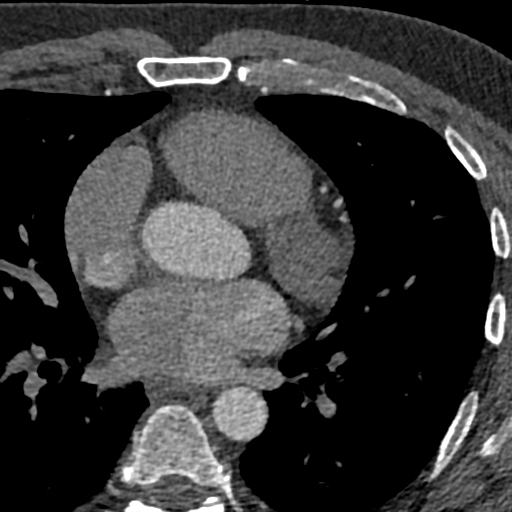
[im 1794/2870  lung]
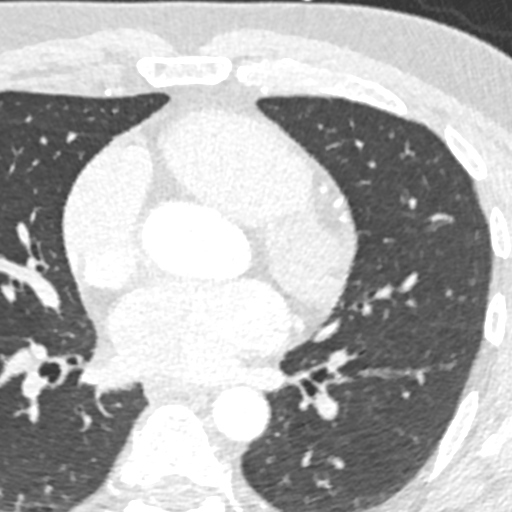
[im 2152/2870  vessel]
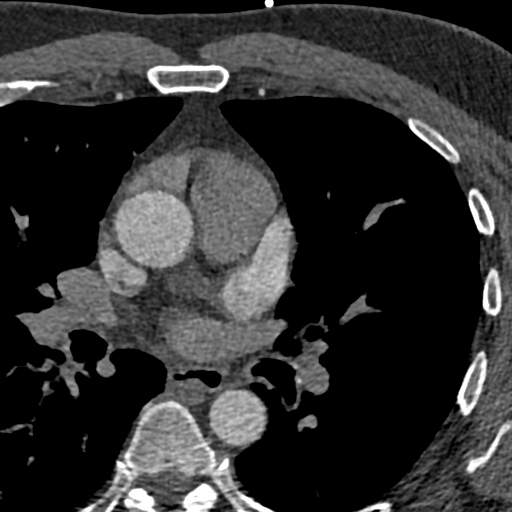
[im 2511/2870  vessel]
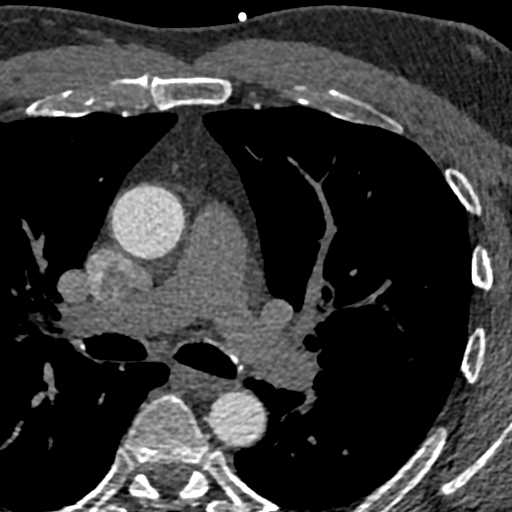

[9 of 20 positions shown; findings below may reference images not displayed]

FINDINGS: A 120 kV prospective scan was triggered in the descending thoracic
aorta at 111 HU's. Gantry rotation speed was 280 msecs and
collimation was .9 mm. No beta blockade and no NTG was given. The 3D
data set was reconstructed in 5% intervals of the 60-80 % of the R-R
cycle. Diastolic phases were analyzed on a dedicated work station
using MPR, MIP and VRT modes. The patient received 80 cc of
contrast.

There is normal pulmonary vein drainage into the left atrium (2 on
the right and 2 on the left) with ostial measurements as follows:

RUPV: 20.5 x 17.4 mm

RLPV: 24.7 x 19.3 mm

LUPV: 24.1 x 17.0 mm

LLPV: 20.8 x 12.8 mm

The left atrial appendage is large with chicken wing morphology and
one major lobe. There is no thrombus in the left atrial appendage.

The esophagus runs to the left from the left atrial midline and is
in the proximity to the LUPV and LLPV.

Aorta:  Normal caliber.  No dissection or calcifications.

Aortic Valve:  Trileaflet.  No calcifications.

Coronary Arteries: Normal coronary origin. Right dominance. The
study was performed without use of NTG and insufficient for plaque
evaluation. Calcifications are seen in LAD and RCA arteries.
IMPRESSION: 1. There is normal pulmonary vein drainage into the left atrium.

2. The left atrial appendage is large with chicken wing morphology
and one major lobe. There is no thrombus in the left atrial
appendage.

3. The esophagus runs to the left from the left atrial midline and
is in the proximity to the LUPV and LLPV.

EXAM:
OVER-READ INTERPRETATION  CT CHEST

The following report is an over-read performed by radiologist Dr.
Trivena Fanggidae [REDACTED] on 07/02/2017. This over-read
does not include interpretation of cardiac or coronary anatomy or
pathology. The coronary CTA interpretation by the cardiologist is
attached.
FINDINGS: Vascular: Heart is borderline in size. Visualized aorta is normal
caliber.

Mediastinum/Nodes: No adenopathy in the lower mediastinum or hila.
Small hiatal hernia.

Lungs/Pleura: Visualized lungs clear.  No effusions.

Upper Abdomen: Imaging into the upper abdomen shows no acute
findings.

Musculoskeletal: Chest wall soft tissues are unremarkable. No acute
bony abnormality.
IMPRESSION: Small hiatal hernia.  No acute extra cardiac abnormality.

Borderline heart size.

## 2019-03-21 ENCOUNTER — Other Ambulatory Visit: Payer: Self-pay

## 2020-03-10 ENCOUNTER — Ambulatory Visit: Payer: 59 | Attending: Internal Medicine

## 2020-03-10 DIAGNOSIS — Z23 Encounter for immunization: Secondary | ICD-10-CM

## 2020-03-10 NOTE — Progress Notes (Signed)
   Covid-19 Vaccination Clinic  Name:  Shawn Hurley    MRN: [id number] DOB: [birth year]  03/10/2020  Mr. Shawn Hurley was observed post Covid-19 immunization for 15 minutes without incident. He was provided with Vaccine Information Sheet and instruction to access the V-Safe system.   Mr. Shawn Hurley was instructed to call 911 with any severe reactions post vaccine: Marland Kitchen Difficulty breathing  . Swelling of face and throat  . A fast heartbeat  . A bad rash all over body  . Dizziness and weakness   Immunizations Administered    Name Date Dose VIS Date Route   Moderna Covid-19 Booster Vaccine 03/10/2020 12:33 PM 0.25 mL 01/11/2020 Intramuscular   Manufacturer: Gala Murdoch   Lot: [id number]   NDC: 91638-466-59

## 2020-04-13 ENCOUNTER — Other Ambulatory Visit: Payer: Self-pay

## 2020-04-16 ENCOUNTER — Ambulatory Visit (INDEPENDENT_AMBULATORY_CARE_PROVIDER_SITE_OTHER): Payer: 59 | Admitting: Family Medicine

## 2020-04-16 ENCOUNTER — Other Ambulatory Visit: Payer: Self-pay

## 2020-04-16 ENCOUNTER — Encounter: Payer: Self-pay | Admitting: Family Medicine

## 2020-04-16 VITALS — BP 124/80 | HR 77 | Temp 98.0°F | Resp 18 | Ht 73.0 in | Wt 245.2 lb

## 2020-04-16 DIAGNOSIS — Z23 Encounter for immunization: Secondary | ICD-10-CM

## 2020-04-16 DIAGNOSIS — Z125 Encounter for screening for malignant neoplasm of prostate: Secondary | ICD-10-CM | POA: Diagnosis not present

## 2020-04-16 DIAGNOSIS — Z0000 Encounter for general adult medical examination without abnormal findings: Secondary | ICD-10-CM | POA: Diagnosis not present

## 2020-04-16 LAB — LIPID PANEL
Cholesterol: 239 mg/dL — ABNORMAL HIGH (ref 0–200)
HDL: 51.8 mg/dL (ref >39.00)
LDL Cholesterol: 150 mg/dL — ABNORMAL HIGH (ref 0–99)
NonHDL: 187.18
Total CHOL/HDL Ratio: 5
Triglycerides: 185 mg/dL — ABNORMAL HIGH (ref 0.0–149.0)
VLDL: 37 mg/dL (ref 0.0–40.0)

## 2020-04-16 LAB — CBC WITH DIFFERENTIAL/PLATELET
Basophils Absolute: 0 10*3/uL (ref 0.0–0.1)
Basophils Relative: 0.9 % (ref 0.0–3.0)
Eosinophils Absolute: 0.2 10*3/uL (ref 0.0–0.7)
Eosinophils Relative: 3.3 % (ref 0.0–5.0)
HCT: 43.5 % (ref 39.0–52.0)
Hemoglobin: 15 g/dL (ref 13.0–17.0)
Lymphocytes Relative: 25.7 % (ref 12.0–46.0)
Lymphs Abs: 1.3 10*3/uL (ref 0.7–4.0)
MCHC: 34.6 g/dL (ref 30.0–36.0)
MCV: 91 fl (ref 78.0–100.0)
Monocytes Absolute: 0.4 10*3/uL (ref 0.1–1.0)
Monocytes Relative: 8.2 % (ref 3.0–12.0)
Neutro Abs: 3.2 10*3/uL (ref 1.4–7.7)
Neutrophils Relative %: 61.9 % (ref 43.0–77.0)
Platelets: 182 10*3/uL (ref 150.0–400.0)
RBC: 4.78 Mil/uL (ref 4.22–5.81)
RDW: 13.3 % (ref 11.5–15.5)
WBC: 5.2 10*3/uL (ref 4.0–10.5)

## 2020-04-16 LAB — BASIC METABOLIC PANEL
BUN: 16 mg/dL (ref 6–23)
CO2: 29 mEq/L (ref 19–32)
Calcium: 9.9 mg/dL (ref 8.4–10.5)
Chloride: 102 mEq/L (ref 96–112)
Creatinine, Ser: 0.89 mg/dL (ref 0.40–1.50)
GFR: 96.49 mL/min (ref >60.00)
Glucose, Bld: 85 mg/dL (ref 70–99)
Potassium: 4.3 mEq/L (ref 3.5–5.1)
Sodium: 138 mEq/L (ref 135–145)

## 2020-04-16 LAB — HEPATIC FUNCTION PANEL
ALT: 26 U/L (ref 0–53)
AST: 21 U/L (ref 0–37)
Albumin: 4.7 g/dL (ref 3.5–5.2)
Alkaline Phosphatase: 77 U/L (ref 39–117)
Bilirubin, Direct: 0.1 mg/dL (ref 0.0–0.3)
Total Bilirubin: 0.7 mg/dL (ref 0.2–1.2)
Total Protein: 7.6 g/dL (ref 6.0–8.3)

## 2020-04-16 LAB — PSA: PSA: 1.96 ng/mL (ref 0.10–4.00)

## 2020-04-16 LAB — TSH: TSH: 1.12 u[IU]/mL (ref 0.35–4.50)

## 2020-04-16 MED ORDER — MOMETASONE FUROATE 50 MCG/ACT NA SUSP: 2.0000 | Freq: Every day | NASAL | 3 refills | Status: AC

## 2020-04-16 MED ORDER — LORATADINE 10 MG PO TABS: 10.0000 mg | ORAL_TABLET | Freq: Every day | ORAL | 3 refills | Status: DC

## 2020-04-16 NOTE — Progress Notes (Signed)
Subjective:    Patient ID: Shawn Hurley, male    DOB: Apr 19, 1965, 55 y.o.   MRN: [id number]  HPI Here for a well exam. He feels fine. He last saw Dr. Johney Frame in Feb 2020. He wears a Education officer, museum watch and he stays in sinus rhythm at all times.    Review of Systems  Constitutional: Negative.   HENT: Negative.   Eyes: Negative.   Respiratory: Negative.   Cardiovascular: Negative.   Gastrointestinal: Negative.   Genitourinary: Negative.   Musculoskeletal: Negative.   Skin: Negative.   Neurological: Negative.   Psychiatric/Behavioral: Negative.        Objective:   Physical Exam Constitutional:      General: He is not in acute distress.    Appearance: Normal appearance. He is well-developed and well-nourished. He is not diaphoretic.  HENT:     Head: Normocephalic and atraumatic.     Right Ear: External ear normal.     Left Ear: External ear normal.     Nose: Nose normal.     Mouth/Throat:     Mouth: Oropharynx is clear and moist.     Pharynx: No oropharyngeal exudate.  Eyes:     General: No scleral icterus.       Right eye: No discharge.        Left eye: No discharge.     Extraocular Movements: EOM normal.     Conjunctiva/sclera: Conjunctivae normal.     Pupils: Pupils are equal, round, and reactive to light.  Neck:     Thyroid: No thyromegaly.     Vascular: No JVD.     Trachea: No tracheal deviation.  Cardiovascular:     Rate and Rhythm: Normal rate and regular rhythm.     Pulses: Intact distal pulses.     Heart sounds: Normal heart sounds. No murmur heard. No friction rub. No gallop.   Pulmonary:     Effort: Pulmonary effort is normal. No respiratory distress.     Breath sounds: Normal breath sounds. No wheezing or rales.  Chest:     Chest wall: No tenderness.  Abdominal:     General: Bowel sounds are normal. There is no distension.     Palpations: Abdomen is soft. There is no mass.     Tenderness: There is no abdominal tenderness. There is no guarding or rebound.   Genitourinary:    Penis: Normal. No tenderness.      Testes: Normal.     Prostate: Normal.     Rectum: Normal. Guaiac result negative.  Musculoskeletal:        General: No tenderness or edema. Normal range of motion.     Cervical back: Neck supple.  Lymphadenopathy:     Cervical: No cervical adenopathy.  Skin:    General: Skin is warm and dry.     Coloration: Skin is not pale.     Findings: No erythema or rash.  Neurological:     Mental Status: He is alert and oriented to person, place, and time.     Cranial Nerves: No cranial nerve deficit.     Motor: No abnormal muscle tone.     Coordination: Coordination normal.     Deep Tendon Reflexes: Reflexes are normal and symmetric. Reflexes normal.  Psychiatric:        Mood and Affect: Mood and affect normal.        Behavior: Behavior normal.        Thought Content: Thought content normal.  Judgment: Judgment normal.           Assessment & Plan:  Well exam. We discussed diet and exercise. Get fasting labs. Set up his first colonoscopy.  Gershon Crane, MD

## 2020-04-19 NOTE — Progress Notes (Signed)
Mychart message sent; Normal except mildly elevated lipids. Watch the diet closely

## 2020-05-14 ENCOUNTER — Encounter: Payer: Self-pay | Admitting: Internal Medicine

## 2020-05-14 ENCOUNTER — Other Ambulatory Visit: Payer: Self-pay

## 2020-05-14 ENCOUNTER — Ambulatory Visit: Payer: 59 | Admitting: Internal Medicine

## 2020-05-14 VITALS — BP 124/78 | HR 74 | Ht 73.0 in | Wt 247.6 lb

## 2020-05-14 DIAGNOSIS — I4819 Other persistent atrial fibrillation: Secondary | ICD-10-CM

## 2020-05-14 NOTE — Patient Instructions (Addendum)
Medication Instructions:  Your physician recommends that you continue on your current medications as directed. Please refer to the Current Medication list given to you today.  Labwork: None ordered.  Testing/Procedures: None ordered.  Follow-Up:  Your physician wants you to follow-up in: 2 years with Hillis Range, MD    You will receive a reminder letter in the mail two months in advance. If you don't receive a letter, please call our office to schedule the follow-up appointment.  Any Other Special Instructions Will Be Listed Below (If Applicable).  If you need a refill on your cardiac medications before your next appointment, please call your pharmacy.

## 2020-05-14 NOTE — Progress Notes (Signed)
PCP: Nelwyn Salisbury, MD   Primary EP: Dr Johney Frame  Shawn Hurley is a 55 y.o. male who presents today for routine electrophysiology followup.  Since last being seen in our clinic, the patient reports doing very well.  Today, he denies symptoms of palpitations, chest pain, shortness of breath,  lower extremity edema, dizziness, presyncope, or syncope.  The patient is otherwise without complaint today.   Past Medical History:  Diagnosis Date  . Allergy   . Atrial flutter (HCC)    typical appearing  . Ejection fraction    EF 55-60%, echo, May, 2013  . High risk medication use    Flecainide  . Persistent atrial fibrillation (HCC)    a. atrial fibrillation, April, 2013, b. 07/2011 Echo: EF 55-60%   Past Surgical History:  Procedure Laterality Date  . ABLATION  08-30-13   PVI and CTI ablation by Dr Johney Frame  . ATRIAL FIBRILLATION ABLATION N/A 08/30/2013   Procedure: ATRIAL FIBRILLATION ABLATION;  Surgeon: Gardiner Rhyme, MD;  Location: [facility] CATH [facility];  Service: Cardiovascular;  Laterality: N/A;  . ATRIAL FIBRILLATION ABLATION N/A 07/03/2017   Procedure: ATRIAL FIBRILLATION ABLATION;  Surgeon: Hillis Range, MD;  Location: [facility] INVASIVE CV LAB;  Service: Cardiovascular;  Laterality: N/A;  . HERNIA REPAIR  2000   umbilical hernia  . TEE WITHOUT CARDIOVERSION N/A 08/29/2013   Procedure: TRANSESOPHAGEAL ECHOCARDIOGRAM (TEE);  Surgeon: Lars Masson, MD;  Location: [facility] ENDOSCOPY;  Service: Cardiovascular;  Laterality: N/A;  . TONSILLECTOMY AND ADENOIDECTOMY    . WRIST SURGERY  1979   left wrist, to drain an infection     ROS- all systems are reviewed and negatives except as per HPI above  Current Outpatient Medications  Medication Sig Dispense Refill  . Ascorbic Acid (VITAMIN C) 1000 MG tablet Take 2,000 mg by mouth daily.    . calcium carbonate (OS-CAL - DOSED IN MG OF ELEMENTAL CALCIUM) 1250 (500 Ca) MG tablet Take 1 tablet by mouth.    . fish oil-omega-3 fatty acids 1000 MG capsule Take 1 g by  mouth daily.    Marland Kitchen ibuprofen (ADVIL) 200 MG tablet Take 400 mg by mouth every 6 (six) hours as needed (for back pain).     Marland Kitchen loratadine (CLARITIN) 10 MG tablet Take 1 tablet (10 mg total) by mouth daily. 90 tablet 3  . mometasone (NASONEX) 50 MCG/ACT nasal spray Place 2 sprays into the nose daily. 51 g 3  . Multiple Vitamin (MULTIVITAMIN) capsule Take 1 capsule by mouth daily.     No current facility-administered medications for this visit.    Physical Exam: Vitals:   05/14/20 1112  BP: 124/78  Pulse: 74  SpO2: 97%  Weight: 247 lb 9.6 oz (112.3 kg)  Height: 6\' 1"  (1.854 m)    GEN- The patient is well appearing, alert and oriented x 3 today.   Head- normocephalic, atraumatic Eyes-  Sclera clear, conjunctiva pink Ears- hearing intact Oropharynx- clear Lungs- Clear to ausculation bilaterally, normal work of breathing Heart- Regular rate and rhythm, no murmurs, rubs or gallops, PMI not laterally displaced GI- soft, NT, ND, + BS Extremities- no clubbing, cyanosis, or edema  Wt Readings from Last 3 Encounters:  05/14/20 247 lb 9.6 oz (112.3 kg)  04/16/20 245 lb 3.2 oz (111.2 kg)  04/26/18 241 lb (109.3 kg)    EKG tracing ordered today is personally reviewed and shows sinus  Assessment and Plan:  1. Persistent afib He has done great post ablation off AAD  therapy No afib in several years.  He follows with his Apple Watch chads2vasc score is 0 No changes  2. HL Mildly elevated HDL looks great! I have advised regular exercise and weight reduction  Return in 2 years  Hillis Range MD, [facility] 05/14/2020 11:33 AM

## 2020-06-19 ENCOUNTER — Telehealth: Payer: Self-pay | Admitting: Internal Medicine

## 2020-06-19 NOTE — Telephone Encounter (Signed)
Will route to Dr. Johney Frame to see if he has any recommendations on pediatric cardiologist?  [facility] downstairs?

## 2020-06-19 NOTE — Telephone Encounter (Signed)
Patient would like to know who Dr. Johney Frame would recommend to treat his son. His Son is 35 and had a physical at [facility] and had an irregular HR pattern. The patient is concerned about who would be treating his Son.

## 2020-06-20 NOTE — Telephone Encounter (Signed)
Left message to call back  

## 2020-06-22 NOTE — Telephone Encounter (Signed)
Okay to leave detailed messages on voicemail.  Left message with Dr. Johney Frame recommendations.

## 2020-10-24 ENCOUNTER — Telehealth: Payer: 59 | Admitting: Family Medicine

## 2021-07-29 ENCOUNTER — Encounter: Payer: Self-pay | Admitting: Gastroenterology

## 2021-09-09 ENCOUNTER — Ambulatory Visit (AMBULATORY_SURGERY_CENTER): Payer: Self-pay | Admitting: *Deleted

## 2021-09-09 VITALS — Ht 73.0 in | Wt 239.0 lb

## 2021-09-09 DIAGNOSIS — Z1211 Encounter for screening for malignant neoplasm of colon: Secondary | ICD-10-CM

## 2021-09-09 MED ORDER — NA SULFATE-K SULFATE-MG SULF 17.5-3.13-1.6 GM/177ML PO SOLN: 1.0000 | ORAL | 0 refills | Status: DC

## 2021-09-09 NOTE — Progress Notes (Signed)
Patient is here in-person for PV. Patient denies any allergies to eggs or soy. Patient denies any problems with anesthesia/sedation. Patient is not on any oxygen at home. Patient is not taking any diet/weight loss medications or blood thinners. Patient is aware of our care-partner policy. Patient notified to use  Good-Rx for prescription.   EMMI education assigned to the patient for the procedure, sent to MyChart.   

## 2021-09-16 ENCOUNTER — Encounter: Payer: Self-pay | Admitting: Gastroenterology

## 2021-09-30 ENCOUNTER — Ambulatory Visit (AMBULATORY_SURGERY_CENTER): Payer: 59 | Admitting: Gastroenterology

## 2021-09-30 ENCOUNTER — Encounter: Payer: Self-pay | Admitting: Gastroenterology

## 2021-09-30 VITALS — BP 118/80 | HR 65 | Temp 98.2°F | Resp 16 | Ht 73.0 in | Wt 239.0 lb

## 2021-09-30 DIAGNOSIS — Z1211 Encounter for screening for malignant neoplasm of colon: Secondary | ICD-10-CM | POA: Diagnosis present

## 2021-09-30 MED ORDER — SODIUM CHLORIDE 0.9 % IV SOLN: 500.0000 mL | Freq: Once | INTRAVENOUS | Status: DC

## 2021-09-30 NOTE — Progress Notes (Signed)
HPI: This is a man at routine risk for CRC   ROS: complete GI ROS as described in HPI, all other review negative.  Constitutional:  No unintentional weight loss   Past Medical History:  Diagnosis Date   Allergy    Atrial flutter (HCC)    typical appearing   Ejection fraction    EF 55-60%, echo, May, 2013   High risk medication use    Flecainide   Persistent atrial fibrillation (HCC)    a. atrial fibrillation, April, 2013, b. 07/2011 Echo: EF 55-60%    Past Surgical History:  Procedure Laterality Date   ABLATION  08-30-13   PVI and CTI ablation by Dr Johney Frame   ATRIAL FIBRILLATION ABLATION N/A 08/30/2013   Procedure: ATRIAL FIBRILLATION ABLATION;  Surgeon: Gardiner Rhyme, MD;  Location: [facility] CATH LAB;  Service: Cardiovascular;  Laterality: N/A;   ATRIAL FIBRILLATION ABLATION N/A 07/03/2017   Procedure: ATRIAL FIBRILLATION ABLATION;  Surgeon: Hillis Range, MD;  [facility]: [facility] INVASIVE CV LAB;  Service: Cardiovascular;  Laterality: N/A;   HERNIA REPAIR  2000   umbilical hernia   TEE WITHOUT CARDIOVERSION N/A 08/29/2013   Procedure: TRANSESOPHAGEAL ECHOCARDIOGRAM (TEE);  Surgeon: Lars Masson, MD;  Location: [facility] ENDOSCOPY;  Service: Cardiovascular;  Laterality: N/A;   TONSILLECTOMY AND ADENOIDECTOMY     WRIST SURGERY  1979   left wrist, to drain an infection     Current Outpatient Medications  Medication Sig Dispense Refill   Ascorbic Acid (VITAMIN C) 1000 MG tablet Take 2,000 mg by mouth daily.     calcium carbonate (OS-CAL - DOSED IN MG OF ELEMENTAL CALCIUM) 1250 (500 Ca) MG tablet Take 1 tablet by mouth.     Multiple Vitamin (MULTIVITAMIN) capsule Take 1 capsule by mouth daily.     mometasone (NASONEX) 50 MCG/ACT nasal spray Place 2 sprays into the nose daily. (Patient not taking: Reported on 09/09/2021) 51 g 3   VITAMIN D PO Take by mouth.     No current facility-administered medications for this visit.    Allergies as of 09/30/2021   (No Known Allergies)    Family History   Problem Relation Age of Onset   Stroke Mother    Heart attack Maternal Grandmother    Heart attack Maternal Grandfather    Colon cancer Neg Hx    Esophageal cancer Neg Hx    Stomach cancer Neg Hx     Social History   Socioeconomic History   Marital status: Married    Spouse name: Not on file   Number of children: Not on file   Years of education: Not on file   Highest education level: Not on file  Occupational History   Not on file  Tobacco Use   Smoking status: Never   Smokeless tobacco: Never  Vaping Use   Vaping Use: Never used  Substance and Sexual Activity   Alcohol use: Yes    Alcohol/week: 2.0 - 3.0 standard drinks of alcohol    Types: 2 - 3 Cans of beer per week   Drug use: No   Sexual activity: Yes  Other Topics Concern   Not on file  Social History Narrative   Lives in [place]. Works as Water engineer at the [employer]: Not on [employer] Insecurity: Not on file  Transportation Needs: Not on file  Physical Activity: Not on file  Stress: Not on file  Social Connections: Not on  file  Intimate Partner Violence: Not on file     Physical Exam: BP 113/75 (BP Location: Right Arm, Patient Position: Sitting, Cuff Size: Normal)   Pulse 77   Temp 98.2 F (36.8 C) (Temporal)   Ht 6\' 1"  (1.854 m)   Wt 239 lb (108.4 kg)   SpO2 95%   BMI 31.53 kg/m  Constitutional: generally well-appearing Psychiatric: alert and oriented x3 Lungs: CTA bilaterally Heart: no MCR  Assessment and plan: 56 y.o. male with routine risk for CRC  Screening colonoscopy todauy  Care is appropriate for the ambulatory setting.  58, MD [facility] 09/30/2021, 7:29 AM

## 2021-09-30 NOTE — Op Note (Signed)
[facility] Patient Name: Shawn Hurley Procedure Date: 09/30/2021 7:58 AM MRN: [id number] Endoscopist: Rachael Fee , MD Age: 56 Referring MD:  Date of Birth: 1965-06-25 Gender: Male Account #: [id number] Procedure:                Colonoscopy Indications:              Screening for colorectal malignant neoplasm Medicines:                Monitored Anesthesia Care Procedure:                Pre-Anesthesia Assessment:                           - Prior to the procedure, a History and Physical                            was performed, and patient medications and                            allergies were reviewed. The patient's tolerance of                            previous anesthesia was also reviewed. The risks                            and benefits of the procedure and the sedation                            options and risks were discussed with the patient.                            All questions were answered, and informed consent                            was obtained. Prior Anticoagulants: The patient has                            taken no previous anticoagulant or antiplatelet                            agents. ASA Grade Assessment: II - A patient with                            mild systemic disease. After reviewing the risks                            and benefits, the patient was deemed in                            satisfactory condition to undergo the procedure.                           After obtaining informed consent, the colonoscope  was passed under direct vision. Throughout the                            procedure, the patient's blood pressure, pulse, and                            oxygen saturations were monitored continuously. The                            CF [id number] #[id number] was introduced through the anus                            and advanced to the the cecum, identified by                            appendiceal orifice and  ileocecal valve. The                            colonoscopy was performed without difficulty. The                            patient tolerated the procedure well. The quality                            of the bowel preparation was good. The ileocecal                            valve, appendiceal orifice, and rectum were                            photographed. Scope In: 7:59:37 AM Scope Out: 8:12:48 AM Scope Withdrawal Time: 0 hours 10 minutes 47 seconds  Total Procedure Duration: 0 hours 13 minutes 11 seconds  Findings:                 Multiple small and large-mouthed diverticula were                            found in the left colon.                           The exam was otherwise without abnormality on                            direct and retroflexion views. Complications:            No immediate complications. Estimated blood loss:                            None. Estimated Blood Loss:     Estimated blood loss: none. Impression:               - Diverticulosis in the left colon.                           - The examination was otherwise normal on direct  and retroflexion views.                           - No polyps or cancers. Recommendation:           - Patient has a contact number available for                            emergencies. The signs and symptoms of potential                            delayed complications were discussed with the                            patient. Return to normal activities tomorrow.                            Written discharge instructions were provided to the                            patient.                           - Resume previous diet.                           - Continue present medications.                           - Repeat colonoscopy in 10 years for screening. Rachael Fee, MD 09/30/2021 8:15:23 AM This report has been signed electronically.

## 2021-09-30 NOTE — Patient Instructions (Signed)
Impression/Recommendations:  Diverticulosis handout given to patient.  Resume previous diet. Continue present medications.  Repeat colonoscopy in 10 years for screening.  YOU HAD AN ENDOSCOPIC PROCEDURE TODAY AT THE [facility]:   Refer to the procedure report that was given to you for any specific questions about what was found during the examination.  If the procedure report does not answer your questions, please call your gastroenterologist to clarify.  If you requested that your care partner not be given the details of your procedure findings, then the procedure report has been included in a sealed envelope for you to review at your convenience later.  YOU SHOULD EXPECT: Some feelings of bloating in the abdomen. Passage of more gas than usual.  Walking can help get rid of the air that was put into your GI tract during the procedure and reduce the bloating. If you had a lower endoscopy (such as a colonoscopy or flexible sigmoidoscopy) you may notice spotting of blood in your stool or on the toilet paper. If you underwent a bowel prep for your procedure, you may not have a normal bowel movement for a few days.  Please Note:  You might notice some irritation and congestion in your nose or some drainage.  This is from the oxygen used during your procedure.  There is no need for concern and it should clear up in a day or so.  SYMPTOMS TO REPORT IMMEDIATELY:  Following lower endoscopy (colonoscopy or flexible sigmoidoscopy):  Excessive amounts of blood in the stool  Significant tenderness or worsening of abdominal pains  Swelling of the abdomen that is new, acute  Fever of 100F or higher  For urgent or emergent issues, a gastroenterologist can be reached at any hour by calling [phone]. Do not use MyChart messaging for urgent concerns.    DIET:  We do recommend a small meal at first, but then you may proceed to your regular diet.  Drink plenty of fluids but you should  avoid alcoholic beverages for 24 hours.  ACTIVITY:  You should plan to take it easy for the rest of today and you should NOT DRIVE or use heavy machinery until tomorrow (because of the sedation medicines used during the test).    FOLLOW UP: Our staff will call the number listed on your records the next business day following your procedure.  We will call around 7:15- 8:00 am to check on you and address any questions or concerns that you may have regarding the information given to you following your procedure. If we do not reach you, we will leave a message.  If you develop any symptoms (ie: fever, flu-like symptoms, shortness of breath, cough etc.) before then, please call [phone].  If you test positive for Covid 19 in the 2 weeks post procedure, please call and report this information to [place].    If any biopsies were taken you will be contacted by phone or by letter within the next 1-3 weeks.  Please call us at [phone] if you have not heard about the biopsies in 3 weeks.    SIGNATURES/CONFIDENTIALITY: You and/or your care partner have signed paperwork which will be entered into your electronic medical record.  These signatures attest to the fact that that the information above on your After Visit Summary has been reviewed and is understood.  Full responsibility of the confidentiality of this discharge information lies with you and/or your care-partner.

## 2021-09-30 NOTE — Progress Notes (Signed)
Vitals-AS  Pt's states no medical or surgical changes since previsit or office visit.  

## 2021-09-30 NOTE — Progress Notes (Signed)
PT taken to PACU. Monitors in place. VSS. Report given to RN. 

## 2021-10-01 ENCOUNTER — Telehealth: Payer: Self-pay | Admitting: *Deleted

## 2021-10-01 NOTE — Telephone Encounter (Signed)
  Follow up Call-     09/30/2021    7:21 AM 09/30/2021    7:10 AM  Call back number  Post procedure Call Back phone  # [phone]   Permission to leave phone message  Yes     Patient questions:  Do you have a fever, pain , or abdominal swelling? No. Pain Score  0 *  Have you tolerated food without any problems? Yes.    Have you been able to return to your normal activities? Yes.    Do you have any questions about your discharge instructions: Diet   No. Medications  No. Follow up visit  No.  Do you have questions or concerns about your Care? No.  Actions: * If pain score is 4 or above: No action needed, pain <4.

## 2022-08-25 ENCOUNTER — Ambulatory Visit: Payer: 59 | Attending: Cardiovascular Disease | Admitting: Cardiovascular Disease

## 2022-08-25 ENCOUNTER — Encounter: Payer: Self-pay | Admitting: Cardiovascular Disease

## 2022-08-25 VITALS — BP 114/82 | HR 87 | Ht 73.0 in | Wt 233.8 lb

## 2022-08-25 DIAGNOSIS — I4819 Other persistent atrial fibrillation: Secondary | ICD-10-CM

## 2022-08-25 NOTE — Progress Notes (Signed)
Electrophysiology Office Note:    Date:  08/25/2022   ID:  Shawn Hurley, DOB Dec 20, 1965, MRN [id number]  PCP:  Shawn Salisbury, MD   [facility] Providers Cardiologist:  None     Referring MD: Shawn Salisbury, MD   History of Present Illness:    Shawn Hurley is a 57 y.o. male with a hx listed below, significant for atrial flutter, persistent atrial fibrillation who presents for electrophysiology follow-up.  He is a former patient of Dr. Johney Hurley and has undergone atrial fibrillation ablations in 2015 and 2019.  He has not had any symptoms of atrial fibrillation recurrence since the second ablation.  His symptoms have always been mild, but he could contact increased fatigue and shortness of breath with exercise prior to the second ablation.  He wears an Scientist, physiological and has not received any atrial fibrillation alerts.  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  Past Medical History:  Diagnosis Date   Allergy    Atrial flutter (HCC)    typical appearing   Ejection fraction    EF 55-60%, echo, May, 2013   High risk medication use    Flecainide   Persistent atrial fibrillation (HCC)    a. atrial fibrillation, April, 2013, b. 07/2011 Echo: EF 55-60%    Past Surgical History:  Procedure Laterality Date   ABLATION  08-30-13   PVI and CTI ablation by Dr Shawn Hurley   ATRIAL FIBRILLATION ABLATION N/A 08/30/2013   Procedure: ATRIAL FIBRILLATION ABLATION;  Surgeon: Shawn Rhyme, MD;  Location: [facility] CATH LAB;  Service: Cardiovascular;  Laterality: N/A;   ATRIAL FIBRILLATION ABLATION N/A 07/03/2017   Procedure: ATRIAL FIBRILLATION ABLATION;  Surgeon: Shawn Range, MD;  Location: [facility] INVASIVE CV LAB;  Service: Cardiovascular;  Laterality: N/A;   HERNIA REPAIR  2000   umbilical hernia   TEE WITHOUT CARDIOVERSION N/A 08/29/2013   Procedure: TRANSESOPHAGEAL ECHOCARDIOGRAM (TEE);  Surgeon: Shawn Masson, MD;  Location: [facility] ENDOSCOPY;  Service: Cardiovascular;  Laterality: N/A;    TONSILLECTOMY AND ADENOIDECTOMY     WRIST SURGERY  1979   left wrist, to drain an infection     Current Medications: Current Meds  Medication Sig   Ascorbic Acid (VITAMIN C) 1000 MG tablet Take 2,000 mg by mouth daily.   calcium carbonate (OS-CAL - DOSED IN MG OF ELEMENTAL CALCIUM) 1250 (500 Ca) MG tablet Take 1 tablet by mouth.   Multiple Vitamin (MULTIVITAMIN) capsule Take 1 capsule by mouth daily.   VITAMIN D PO Take by mouth.     Allergies:   Patient has no known allergies.   Social and Family History: Reviewed in Epic  ROS:   Please see the history of present illness.    All other systems reviewed and are negative.  EKGs/Labs/Other Studies Reviewed Today:    Echocardiogram:  2019 TTE Normal LV structure and function, mildly dilated LA   Monitors:   Stress testing:   Advanced imaging:   Cardiac catherization   EKG:  Last EKG results: today - sinus rhythm   Recent Labs: No results found for requested labs within last 365 days.     Physical Exam:    VS:  BP 114/82   Pulse 87   Ht 6\' 1"  (1.854 m)   Wt 233 lb 12.8 oz (106.1 kg)   SpO2 97%   BMI 30.85 kg/m     Wt Readings from Last 3 Encounters:  08/25/22 233 lb 12.8 oz (106.1  kg)  09/30/21 239 lb (108.4 kg)  09/09/21 239 lb (108.4 kg)     GEN: Well nourished, well developed in no acute distress CARDIAC: RRR, no murmurs, rubs, gallops RESPIRATORY:  Normal work of breathing MUSCULOSKELETAL: no edema    ASSESSMENT & PLAN:    Persistent atrial fibrillation He has been doing very well status post ablation 2 years ago.  He is not on antiarrhythmic drug therapy. No recurrence of atrial fibrillation in the past 5 years CHA2DS2-VASc score is 0  Will arrange follow-up in 2 years. He will call us if he has any evidence of AF recurrence.  This was my first time meeting [name]. I spent 35 minutes this visit.        Medication Adjustments/Labs and Tests Ordered: Current medicines are  reviewed at length with the patient today.  Concerns regarding medicines are outlined above.  No orders of the defined types were placed in this encounter.  No orders of the defined types were placed in this encounter.    Signed, Shawn Small, MD  08/25/2022 3:49 PM    [facility]

## 2022-08-25 NOTE — Patient Instructions (Signed)
Medication Instructions:  Your physician recommends that you continue on your current medications as directed. Please refer to the Current Medication list given to you today. *If you need a refill on your cardiac medications before your next appointment, please call your pharmacy*   Follow-Up: At [facility], you and your health needs are our priority.  As part of our continuing mission to provide you with exceptional heart care, we have created designated Provider Care Teams.  These Care Teams include your primary Cardiologist (physician) and Advanced Practice Providers (APPs -  Physician Assistants and Nurse Practitioners) who all work together to provide you with the care you need, when you need it.  We recommend signing up for the patient portal called "MyChart".  Sign up information is provided on this After Visit Summary.  MyChart is used to connect with patients for Virtual Visits (Telemedicine).  Patients are able to view lab/test results, encounter notes, upcoming appointments, etc.  Non-urgent messages can be sent to your provider as well.   To learn more about what you can do with MyChart, go to [email or web address].    Your next appointment:   2 year(s)  Provider:   York Pellant, MD
